# Patient Record
Sex: Male | Born: 1990 | Race: White | Hispanic: No
Health system: Western US, Academic
[De-identification: ages and names within clinical notes are randomized; demographics above are authoritative.]

## PROBLEM LIST (undated history)

## (undated) ENCOUNTER — Emergency Department (HOSPITAL_COMMUNITY): Admission: EM | Payer: Medicare Other | Source: Home / Self Care

## (undated) DIAGNOSIS — R569 Unspecified convulsions: Secondary | ICD-10-CM

## (undated) HISTORY — DX: Unspecified convulsions: R56.9

## (undated) NOTE — Progress Notes (Signed)
Formatting of this note might be different from the original.  SW was consulted to assist pt in safe dc planning.    Sw visited pt bedside at which pt was awake and watching tv. Pt advised SW that he came from [place] approximately 2 weeks ago to be with a friend he considers to be a sister.  Pt reported that he caught the [employer] to [place] and the friend gave him false hope regarding housing situation. Pt reports that he had been staying [facility] shelter since returning to [place] but can not go back to the shelter and did not wish to go into detail. Pt was tangential and talkative. Pt stated that she wants to be dc to the waiting room so that she may go to [facility] in the morning and get a voucher so that she may go to [place] where her boyfriend is.     SW contacted [facility] to verify pt's status with them.  [facility] confirmed that pt may not return but did not have any details to offer.  SW contacted [facility] which advised SW that they have space available and they would anticipate pt's arrival.    SW visited pt bedside and provided pt with the information for [facility] and educated pt on ways they may assist with her situation.     SW and pt discussed transportation options and how to pull bus route up on [employer].    Pt in agreement with plan.    SW communicated plan with medical team and advised that pt refused Seroquel offered by medic during SW visit. SW advised medical staff that pt appeared to be exhibiting symptoms of mania but otherwise denies SI/HI.    PLAN: Dc to [employer]. Gwenevere Abbot, MSW, LSW, [facility]  ED Social Worker      Electronically signed by Paulita Fujita, LSW at 04/16/2022 12:49 AM EDT

## (undated) NOTE — ED Provider Notes (Signed)
Formatting of this note is different from the original.    CHIEF COMPLAINT   Chief Complaint   Patient presents with   ? Suicidal With A Plan         HISTORY OF PRESENT ILLNESS   HPI  Patient is a 32 year old male with reported history of seizures or numbers of depression presenting to the emergency department reporting suicidal ideation with a plan to hang himself.  Has a history of same with attempted to hang himself at age 36.  Denies HI or auditory hallucinations.  Patient was seen earlier today in [facility] ED and discharged after social work eval.  Patient reports coming here because he continues to feel unsafe.  Denies medical complaints.  Denies COVID symptoms.  Denies chest pain, shortness of breath, cough, fevers, chills, URI symptoms.  No recent drug use or alcohol use.  He is currently homeless does not stay in shelters.    Patient reports taking Seroquel 300 mg nightly as well as Dilantin 300 mg nightly for his seizure disorder.  He has not taken his Dilantin tonight and asks for dose here.  He does report that he otherwise has his medications in his possession.      PAST MEDICAL AND SURGICAL HISTORY     Seizure disorder  Depression                     MEDICATIONS AND ALLERGIES     OUTPATIENT MEDICATIONS:   No current outpatient medications    ALLERGIES:   Bactrim [sulfamethoxazole-trimethoprim], Geodon [ziprasidone], Haloperidol, and Latex       SOCIAL HISTORY       Denies tobacco, alcohol, illicits.  Homeless.          PAST FAMILY HISTORY             REVIEW OF SYSTEMS   Review of Systems   Constitutional: Negative for fever and chills.   HENT: Negative for congestion.    Respiratory: Negative for shortness of breath.    Cardiovascular: Negative for chest pain.   Gastrointestinal: Negative for nausea and vomiting.   Genitourinary: Negative for dysuria.   Musculoskeletal: Negative for myalgias.   Skin: Negative for pallor.   Neurological: Negative for headaches.   Psychiatric/Behavioral: Positive for  suicidal ideas.         PHYSICAL EXAM   ED VITALS:  Vitals (Arrival)      T: 36.7 C (02/17/21 0046)  BP: 128/60 (02/17/21 0046)  HR: 92 (02/17/21 0046)  RR: 16 (02/17/21 0046)  SpO2: 95 % (02/17/21 0046) Room air   Vitals (Most recent in last 24 hrs)   T: 36.7 C (02/17/21 0046)  BP: 130/64 (02/17/21 0530)  HR: 82 (02/17/21 0530)  RR: 17 (02/17/21 0530)  SpO2: 96 % (02/17/21 0530) Room air  T range: Temp  Min: 36.3 C  Max: 36.7 C  (no weight taken for this visit)     (no height taken for this visit)     There is no height or weight on file to calculate BMI.     Physical Exam  Constitutional:       Comments: Alert young male sitting up in stretcher, pleasant, no acute distress   HENT:      Head: Normocephalic and atraumatic.      Right Ear: External ear normal.      Left Ear: External ear normal.      Nose: Nose normal.      Mouth/Throat:  Mouth: Mucous membranes are moist.      Pharynx: Oropharynx is clear.   Eyes:      Pupils: Pupils are equal, round, and reactive to light.   Neck:      Musculoskeletal: Normal range of motion.   Cardiovascular:      Rate and Rhythm: Normal rate and regular rhythm.      Pulses: Normal pulses.      Heart sounds: Normal heart sounds.   Pulmonary:      Effort: Pulmonary effort is normal.      Breath sounds: Normal breath sounds.   Abdominal:      General: Abdomen is flat.      Palpations: Abdomen is soft.   Musculoskeletal:         General: Normal range of motion.   Skin:     General: Skin is warm and dry.      Capillary Refill: Capillary refill takes less than 2 seconds.   Neurological:      General: No focal deficit present.      Mental Status: He is oriented to person, place, and time.   Psychiatric:         Mood and Affect: Mood normal.         Behavior: Behavior normal.         LABORATORY:   Labs Reviewed   CBC, DIFF       Result Value    WBC 5.80      RBC 5.55      Hemoglobin 16.0      Hematocrit 46      MCV 83      MCH 28.8      MCHC 34.6      Platelet Count 185       RDW-CV 12.6      % Neutrophils 61      % Lymphocytes 27      % Monocytes 9      % Eosinophils 2      % Basophils 1      % Immature Granulocytes 0      Neutrophils 3.59      Absolute Lymphocyte Count 1.55      Monocytes 0.51      Absolute Eosinophil Count 0.10      Basophils 0.04      Immature Granulocytes 0.01      Nucleated RBC 0.00      % Nucleated RBC 0     COMPREHENSIVE METABOLIC PANEL    Sodium 137      Potassium 3.7      Chloride 103      Carbon Dioxide, Total 26      Anion Gap 8      Glucose 101      Urea Nitrogen 15      Creatinine 0.77      Protein (Total) 6.9      Albumin 3.9      Bilirubin (Total) 0.3      Calcium 9.3      AST (GOT) 18      Alkaline Phosphatase (Total) 64      ALT (GPT) 30      eGFR by CKD-EPI >60     ALCOHOL (ETHYL)    Alcohol (Ethyl) Negative     STANDARD DRUG SCREEN, URN    Amphet/Methamphetamine Qual,URN Negative      Barbiturate (Qual), URN Negative      Benzodiazepines (Qual), URN Negative      Cocaine (  Qual), URN Negative      Alcohol (Ethyl), URN Negative      Methadone (Qual), URN Negative      Opiates (Qual), URN Negative      Phencyclidine (Qual), URN Negative      Cannabinoids (Qual), URN Negative      Tricyclic Antidepressants, URN Negative      Acetaminophen Qualitative, URN Negative      Fentanyl Qual, URN Negative      Drug Screen Test Info, URN SEE NOTES     SARS-COV-2 (COVID-19) QUALITATIVE RAPID PCR    COVID-19 Coronavirus Qual PCR Specimen Type Nasal swab      COVID-19 Coronavirus Qual PCR Result None detected      COVID-19 Coronavirus Qual PCR Interpretation        Value: This is a negative result. Laboratory testing alone cannot rule out infection, particularly in the presence of clinical risk factors such as symptoms or exposure history.    COVID-19 Qualitative PCR Indication Admission Surveillance     2ND EXTRA PEARL TOP    2nd Extra Pearl Top Additional collection tube     FIRST EXTRA URINE GRAY TOP       IMAGING:     ED Wet Read -   No orders to display      Radiology Final Result -   No image results found.        EKG DOCUMENTATION      No EKG was performed        TRAUMA/STROKE/STEMI ACTIVATION          SUICIDE RISK EVALUATION          SEPSIS          4TH  YEAR RESIDENT         ED COURSE/MEDICAL DECISION MAKING         Patient is a 11 year old male who presents emergency department reporting suicidal ideation with a plan to hang himself.  On arrival to the ED the patient is afebrile, ambulatory, no acute distress.  Vital signs are within normal limits for age.  Patient is calm and cooperative.  No overt toxidrome or evidence of intoxication.  No recent trauma.  Patient has no current medical complaints with the exception of requesting his evening dose of Dilantin.    Screening labs were obtained which are normal.  Urine drug screen ordered and pending.  I discussed the patient with psychiatric emergency services given his ongoing suicidal ideation and they will evaluate in the main ED.    Patient seen by psychiatric emergency services, cleared, will be discharged with PCP follow-up and outpatient mental health appointment later this week.        CLINICAL IMPRESSION/DISPOSITION   Clinical Impressions:   [R45.851] Suicidal ideation     Disposition: Discharge      CRITICAL CARE - ATTENDING ONLY   Critical Care - No Critical Care        ADDITIONAL INFORMATION REVIEWED  - ATTENDING Dayton Bailiff, MD  Resident  02/17/21 [phone]    Electronically signed by Lambert Keto, MD at 02/20/2021  3:00 PM PDT    Associated attestation - Utarnachitt, Cyndie Chime, MD - 02/20/2021  3:00 PM PDT  Formatting of this note might be different from the original.  Images from the original note were not included.  I saw and evaluated the patient. I have reviewed the resident's/fellow's findings and agree.     No  Critical Care    Richard B. Utarnachitt M.D.  Attending Physician  Department of Emergency Medicine  [facility]  02/20/21  3:00 PM

## (undated) NOTE — Progress Notes (Signed)
Formatting of this note is different from the original.  [facility]  [facility] - Tele-Behavioral Health Crisis Access Screening  Visit was conducted with the use of interactive audio and video telecommunications systems that permits real time communication between provider and patient    Patient location at time of Tele-Consult: [facility]  Provider Location at time of Tele-Consult: Remote  Patient Name:  Samuel Fry  Date of Birth:  July 08, 1991   Today's Date:  Jan 10, 2020    61 minutes spent related to assessment and crisis stabilization.    Provisional Diagnosis   Provisional Diagnosis: F99- Unspecified Mental D/O  Primary Presenting Problem: Mental Health  ASAM: N/A  LOCUS Scores: 18  Behavioral Health Acuity: Level 2    Disposition   MD Contact Name: Alvera Singh DO  MD Contact Date: 01/10/20  MD Contact Time: 2204  Disposition Recommendation: Inpatient Admission    Triage Screen     Type of Screen: If NOT Face to Face, Skip to Disposition Section): Face to Face  Referral Source: [facility]  Referral Source Contact Number: [phone]  Release Signed: No  Referral Source Contacted: Yes  Release for Community Providers: No  Information Provided By:: Patient  Court Appointed Guardian: No  Are you a Veteran?: No  Precipitating Factors: Clinet is a 55 year old male presenting to the ED with Suicidal ideation with a plan to hang himself.  Client was released from a 7 day stay at a behavioral health unit about 2 days ago. Client endorsed suicidal ideation and reports a plan to hang himself.   Client reports he needs a medical bed becasue he has a seizure disorder "its gets bad".  Date of last yearly physical:: unkown  Outside help or community services at home: None  Is there anyone that you know, or are related to, on the Behavioral Health unit?: No    General Information   Type of Screen: If NOT Face to Face, Skip to Disposition Section): Face to Face  Referral Source:  [facility]  Referral Source Contact Number: [phone]  Release Signed: No  Referral Source Contacted: Yes  Release for Community Providers: No  Information Provided By:: Patient  Court Appointed Guardian: No  Are you a Veteran?: No  Precipitating Factors: Clinet is a 12 year old male presenting to the ED with Suicidal ideation with a plan to hang himself.  Client was released from a 7 day stay at a behavioral health unit about 2 days ago. Client endorsed suicidal ideation and reports a plan to hang himself.   Client reports he needs a medical bed becasue he has a seizure disorder "its gets bad".  Date of last yearly physical:: unkown  Outside help or community services at home: None  Is there anyone that you know, or are related to, on the Behavioral Health unit?: No    Potential Risk to Self   Suicidal threats/behaviors in past 6 months?: Yes(Client reports threats to hang himself)  Suicidal Ideation or Suicide Threats: Yes(Client endorses sucidial ideations with a pland to hang himself)  Recent attempt to Harm Self?: No(Client responded "not yet")  Intent for above: Yes(Client responded with "yes")  Currently engaging in self-injurious behavior?: No  History of Suicidal/Self-Injuring behaviors?: Yes(Client reports a history of sucidial ideation.  Reports last inpatient hospitalization about 2 days ago.)  History of Suicidal/Self Injurious Behavior Last 6 months?: Yes  History of Suicidal/Self-Injuring behaviors  Greater than the past 6 months?: Yes  Access to firearms?: No  Other means of Harm?: No    Potential Risk to Others   Homicidal threats/behaviors in past 6 months?: No  Homicidal Ideation or Homicidal Threats?: No  Named Individual: No  Recent attempt to Harm Another?: No  Intent for above: No  Patient currently assaultive or combative?: No  History of Homicidal Acts/Assaultive behaviors?: No  History of Homicidal Acts/Assaultive behaviors within past 6 months?: No  History of Homicidal Acts/Assaultive behaviors    Greater than the past 6 months?: No  Access to firearms?: No  Other means of Harm?: No    Symptoms   Sleep pattern changed: No  Sleeping increased: No  Sleeping decreased: No  Problems: No  Use sleep aid: Yes  Type of Sleep Aid: Seroquel- 200mg  and trazadone    Appetite change: No  Weight change: No  Appetite Problems:: No    Hopelessness/Helplessness: No  Crying spells/mood swings: No  Low energy/fatigue: No  Concentration problems: No  Psychomotor retardation/agitation: No  Feelings of guilt/worthlessness: No  Social withdrawal: No  Recurrent thoughts of death: (Client reports sucidial ideation with a plan to hang himself)  Deterioration in Activities of Daily Living: No    Rapid pressured speech: No  Increase in impulsivity: No  Increase in energy: No  Flight of ideas/loose association: No    Excessive worry: No  Nervousness: No  Irritability: No  Shortness of breath: No  Racing heart rate: No  Sweaty/Chills/Hot flashes: No  Nausea/Vomiting/Diarrhea: No  Chest Pain: No    Additional Symptom Information: Client is a 35 year old male presenting with sucidial ideation, reports a plan to hang himself, and intentions to do so.  Client reports recent release from an inpatient facility about 2 days ago.  Client reports that he has a seizure condition and is requeting a "medical bed".  The client reports that he plans to move to [place] in 2 weeks.  Client reports that he does not have a therapist or a psycharist.  Denies homicidal ideation and or attmepts.    Psychosis   Delusions: No Delusions  Hallucinations: None  Ambivalence: No (Comment)  Confusion: No (Comment)  Disorganization: No (Comment)    Treatments   Treatments?: No  Did you follow up with your aftercare appointment?: Yes  Did you take your medication as prescribed?: Yes    Substance Use   Substance use in past 12 months?: No  Drug Screen: Negative  History of Substance Use/Abuse:: Patient Denies any history or Current Use  Type of Tobacco/Nicotine:  Cigarettes  Cigarette - Amount/Frequency: 1 PPD  Daily use of 5 or more cigarettes (more than 1/4 pack) in the past 30 days?: Yes  Tobacco/Nicotine -  Age of First Use: 18 yrs  Tobacco/Nicotine -  Duration of Current Use: continuous use  Tobacco/Nicotine -  Last Used: today  Tobacco/Nicotine -  Longest Period Not Used: unreported  Other non-substance addictive behaviors:: denied any    Alcohol Abuse   Alcohol abuse in past 12 months?: No  History of Alcohol Use/Abuse:: Patient Denies any history or Current Use  #1. How often do you have a drink containing alcohol?: Never  #9  Have you or someone else been injured as a result of your drinking?: No  #10  Has a relative, friend, doctor or health worker been concerned about your drinking or suggested you cut down?: No    Functioning   Dressing: Independent  Bathing: Independent  Toileting: Independent  Feeding: Independent  Hearing -  Right Ear: Difficulty with noise  Hearing - Left Ear: Difficulty with noise  Vision - Right Eye: Functional  Vision - Left  Eye: Functional  Walks in Home: Independent  Patient Fall Risk Level: Low/medium  Possible barriers to participate in Treatment/Programming?: No  Current living arrangements (who lives with): Client reports he is homeless until he moves to [place] in two weeks.  Able to return to Current Living Arrangements?: Yes  Support System:: friends that are supportive  Financial concerns: Housing;Utilities;Medications  Healthy coping skills: Other:(Watch [employer] and funny viedos)  Recreational/Leisure activities: known  Religious/Spiritual orientation: Unknown  Cultural Preferences: Unknown    Strengths/Limitations   Strength 1: inpersonations  Limitation 1: managment of weight    BH History   Patient Employed?: No  Problems at work?: No  History of Abuse?: (UTA)  Trauma: UTA  Bereavement: UTA    Legal Issues   Legal: No  Engineer, drilling?: No    Child/Adolescent Assessment       Mental Status   General Appearance: Older than  stated age;Disheveled  Motor Activity: Decreased;Agitated;Restless  Speech: Rambling  Exhibited Behavior: Cooperative;Impulsive;Friendly  Affect Range /Display: Wide  Mood Range Belenda Cruise: Wide  Affect/Mood Display: Calm;Bright  Mood: Expansive;Elated  Thought Process: WDL  Thought Content: Flight of Ideas;Loose Association  Insight: Limited  Orientation To:: Person (Yes);Place (Yes);Situation (Yes);Date (Yes)      Electronically signed:  Autumn Messing, LCSW  01/10/2020 / 10:08 PM    Electronically signed by Autumn Messing, LCSW at 01/10/2020 10:08 PM EDT

## (undated) NOTE — Progress Notes (Signed)
Formatting of this note might be different from the original.  Client endorses sucidal ideation,a plan to hang himself,and reports intentions to do so.  Client reports a recent discharge from a behavioral health facility about 2 days ago.  Client reports recurring thoughts of death. Client denies homicidal ideation, plans, or attempts.      Therapist consulted with the attending provider Dr. Alvera Singh DO. It is recommended that the client receive inpatient services.  The attending provider is in agreement with this disposition.   Electronically signed by Autumn Messing, LCSW at 01/10/2020 10:11 PM EDT

## (undated) NOTE — ED Notes (Signed)
Formatting of this note might be different from the original.  Pt is medically cleared per Dr. Katrinka Blazing.    Electronically signed by Doree Fudge., RN at 01/14/2021  1:58 AM CDT

## (undated) NOTE — ED Notes (Signed)
Formatting of this note might be different from the original.  Pt is in bed at this time in NAD. Pt. Was tickled on his foot and withdrew from it. Pt. Respirations are even and unlabored. Will continue to monitor.     Isaias Cowman, RN  09/22/16 1920  Electronically signed by Isaias Cowman, RN at 09/22/2016  7:20 PM CST
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
## (undated) NOTE — ED Triage Notes (Signed)
Formatting of this note might be different from the original.  Patient arrives via EMS, per medic patient was being checked out by LE and was asked to bring patient to ED for checkup after being off psych medications for a "couple days." Patient admits to being off medications after traveling from [place] and patient states he is feeling manic at this time. Patient is alert and oriented to person place time, denies pain.   Electronically signed by Leonie Douglas, RN at 03/17/2020  7:38 AM CDT

## (undated) NOTE — ED Notes (Signed)
Formatting of this note might be different from the original.  Clinicals faxed to [facility].    Manson Allan, RN  09/23/16 [phone]  Electronically signed by Manson Allan, RN at 09/23/2016  8:11 AM CST

## (undated) NOTE — ED Provider Notes (Signed)
Formatting of this note is different from the original.  History   Chief Complaint   Patient presents with   ? Chest Pain     CP for 2 hrs.      HPI: Patient is a 71 y/o WM with PMH Asperger, ADHD, bipolar disorder who presents with c/o chest pain. Began two hours PTA while sitting at a bar smoking. Reports that he has frequent chest pains that feel the same with frequent ED visits, but has never been diagnosed with a cause for them. 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 No recent vomiting or trauma. Not associated with SOB or diaphoresis. It is sharp and stabbing and located across his left chest. Nothing makes better or worse. He has not taken anything for it. He was admitted to psych unit for SI at [facility] from last Saturday until he was discharged yesterday. Denies current SI/HI/AVH. States he is from [place] and did not have anywhere to go today, so he has been wandering around. States he has a ticket to get on the train to [place] this morning at 1100, and will then catch a connecting train from [place] to [place]. No known family history, as he is adopted.     HISTORY:   History documented here is for the purposes of medical decision making and the ED physician note. It will NOT file to the patient's permanent history.:     Past Medical History:   Diabetes:  No    Social History:   Tobacco:  Tobacco use   ETOH:  No    Illicit Drugs:  No       Past Medical History:   Diagnosis Date   ? ADHD (attention deficit hyperactivity disorder)    ? Asperger syndrome    ? Bipolar 1 disorder    ? Bowel and bladder incontinence        Past Surgical History:   Procedure Laterality Date   ? BOWEL RESECTION      Questionable as no abdominal scar noted       Family History   Problem Relation Age of Onset   ? Arthritis Mother    ? Arthritis Sister    ? Diabetes Neg Hx    ? Heart disease Neg Hx    ? Hypertension Neg Hx    ? Arthritis Maternal Aunt        Social  History     Tobacco Use   ? Smoking status: Current Every Day Smoker     Packs/day: 1.00     Years: 0.70     Pack years: 0.70   ? Smokeless tobacco: Never Used   Substance Use Topics   ? Alcohol use: No   ? Drug use: No     Review of Systems   Constitutional: Negative for chills and fever.   HENT: Negative for congestion, postnasal drip, rhinorrhea, sinus pressure, sinus pain and sore throat.    Eyes: Negative for pain and visual disturbance.   Respiratory: Negative for chest tightness, shortness of breath and wheezing.    Cardiovascular: Positive for chest pain. Negative for palpitations and leg swelling.   Gastrointestinal: Negative for abdominal pain, constipation, diarrhea, nausea and vomiting.   Genitourinary: Negative for decreased urine volume, difficulty urinating and dysuria.   Musculoskeletal: Negative for back pain and neck pain.   Skin: Negative for pallor, rash and wound.   Neurological: Negative for dizziness, seizures, syncope, weakness, light-headedness,  numbness and headaches.   Psychiatric/Behavioral: Negative for hallucinations and self-injury.   All other systems reviewed and are negative.     Physical Exam   BP 135/87 (BP Location: Right arm)   Pulse 92   Temp 98.2 F (36.8 C) (Oral)   Resp 18   Ht 1.702 m (5\' 7" )   Wt (!) 112.5 kg (248 lb)   SpO2 100%   BMI 38.84 kg/m     Physical Exam  Vitals signs and nursing note reviewed.   Constitutional:       General: He is not in acute distress.     Appearance: He is well-developed. He is not toxic-appearing or diaphoretic.   HENT:      Head: Normocephalic and atraumatic.      Right Ear: External ear normal.      Left Ear: External ear normal.      Nose: Nose normal.   Eyes:      General: Lids are normal. No scleral icterus.     Conjunctiva/sclera: Conjunctivae normal.   Neck:      Musculoskeletal: Normal range of motion and neck supple.   Cardiovascular:      Rate and Rhythm: Normal rate.      Pulses:           Radial pulses are 2+ on the  right side and 2+ on the left side.      Heart sounds: S1 normal and S2 normal.   Pulmonary:      Effort: Pulmonary effort is normal. No respiratory distress.      Breath sounds: No rhonchi.   Chest:      Chest wall: Tenderness (L costosternal joint) present. No mass, lacerations, deformity, swelling, crepitus or edema.   Abdominal:      General: There is no distension.      Palpations: Abdomen is soft.      Tenderness: There is no abdominal tenderness. There is no guarding.   Musculoskeletal: Normal range of motion.   Skin:     General: Skin is warm and dry.      Coloration: Skin is not pale.   Neurological:      Mental Status: He is alert and oriented to person, place, and time.      GCS: GCS eye subscore is 4. GCS verbal subscore is 5. GCS motor subscore is 6.      Gait: Gait normal.   Psychiatric:         Attention and Perception: Attention normal.         Mood and Affect: Mood normal.         Speech: Speech normal.         Behavior: Behavior is cooperative.         Thought Content: Thought content does not include homicidal or suicidal ideation.         Cognition and Memory: Cognition and memory normal.          ED Course   Procedures    MDM  -Initial vital signs reviewed and WNL. Afebrile.   -Physical exam as above  -EKG NSR at rate of 82 with normal intervals and no ST segment or T wave changes  -CXR with no cardiopulmonary changes  -Chem 8 WNL  -1L NS bolus given and toradol given for pain with moderate relief    Disposition:  -At this time, I feel that patient has reached maximum benefit from ED workup, management and observation. I explained all findings  to patient and answered all questions to their satisfaction. I educated them on the general/expected course of the condition, and also informed them that our evaluation in the emergency department today is Samuel evaluation of the condition in one moment in time. If there is any worsening of the condition or if symptoms persist, the patient was instructed to  return to the ED immediately for further evaluation. Patient voiced understanding of all instructions and is agreeable to disposition.  -Follow up with PCP in 1 week for re-evaluation  -Discharged to home in good condition.     ED COURSE as of Feb 08 807   Thu Feb 08, 2019   0627 POCT SODIUM: 139 [TL]   0627 POCT POTASSIUM: 3.6 [TL]   0627 POCT CHLORIDE: 103 [TL]   0627 POCT TCO2: 26 [TL]   0627 POCT BUN: 15 [TL]   0627 POC GLU HEP WHOLE BLOOD: 88 [TL]   0627 POC IONIZED CALCIUM: 1.24 [TL]   0627 POCT HEMATOCRIT: 51.0 [TL]   0627 POC HEMOGLOBIN (CALC)(!): 17.3 [TL]   0627 Creatinine Serum/WB: 0.80 [TL]   0627 eGFR: >=60 [TL]   0627    IMPRESSION:     1. No active cardiopulmonary disease.      XR Chest 1 View [TL]      Clinical Impressions   Final diagnoses:   Costochondral chest pain         Plan   ED Disposition     ED Disposition Condition Comment    Discharge Stable [facility] discharge to home/self care.           Discharge information if applicable:   Follow-up Information     Follow up With Specialties Details Why Contact Info    your primary care doctor  In 1 week           Discharge Medications     Medications To Continue    citalopram 40 mg tablet  Refills:  0  Commonly known as:  CELEXA    divalproex 500 MG EC tablet  Refills:  0  Commonly known as:  DEPAKOTE    * phenytoin 300 MG ER capsule  Refills:  0  Commonly known as:  DILANTIN    * phenytoin 300 MG ER capsule  Refills:  0  Commonly known as:  DILANTIN    PROZAC PO  Refills:  0    * QUEtiapine 400 MG tablet  Refills:  0  Commonly known as:  SEROQUEL    * QUEtiapine 300 MG tablet  Refills:  0  Commonly known as:  SEROQUEL    traZODone 150 MG tablet  Refills:  0  Commonly known as:  DESYREL       * There are duplicate medications prescribed to the patient               Lorie Apley, MD  Resident  02/08/19 [phone]    Electronically signed by Karmen Stabs., MD at 02/08/2019  2:36 PM CDT    Associated attestation - Shermer, Willene Hatchet.,  MD - 02/08/2019  2:36 PM CDT  Formatting of this note might be different from the original.    I performed a history and physical examination of Samuel Fry and discussed his management with the Emergency Medicine resident(s).  I agree with the history, physical, assessment, and plan of care, with the following exceptions: None    ECG normal.      CXR: Xr Chest 1  View    Result Date: 02/08/2019  IMPRESSION: 1. No active cardiopulmonary disease.   Attending Radiologist: Illene Labrador. Effie Shy, M.D. 02/08/2019 6:22 AM      Treated symptomatically.  Review of EMR shows multiple visits to multiple EDs for similar concern.  He is leaving for [place] via train at 1100.  Has ticket with him.  Has been wandering about since recent discharge from OSH and is hungry and thirsty.  No SI/HI or hallucinations.  Stable for discharge.    Karmen Stabs, MD

## (undated) NOTE — ED Notes (Signed)
Formatting of this note might be different from the original.  Attempted to transport pt to [facility], advised pt no longer accepted. [facility] and [facility] notified.    Manson Allan, RN  09/23/16 1303  Electronically signed by Manson Allan, RN at 09/23/2016  1:03 PM CST

## (undated) NOTE — ED Notes (Signed)
Formatting of this note might be different from the original.  BH assessment complete  Electronically signed by Netta Corrigan, RN at 01/12/2020 10:48 AM EDT

## (undated) NOTE — ED Notes (Signed)
Formatting of this note might be different from the original.  [facility] consult called in   Electronically signed by Mellody Memos, CNA at 01/10/2020  6:56 PM EDT
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
## (undated) NOTE — ED Notes (Signed)
Formatting of this note might be different from the original.  Pt is up in the bed at this time. Pt. In NAD. Pt. Watching TV. Pt. Respirations are even and unlabored. Will continue to monitor    Isaias Cowman, RN  09/22/16 2207  Electronically signed by Isaias Cowman, RN at 09/22/2016 10:07 PM CST
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
## (undated) NOTE — ED Notes (Signed)
Formatting of this note might be different from the original.  Patient redirected to plan of care. No time on [facility] transport.   Electronically signed by Abbey Chatters Ridenhour, RN at 01/14/2020  3:25 PM EDT

## (undated) NOTE — Unmapped External Note (Signed)
Formatting of this note might be different from the original.  Patient medically evaluated and cleared by my colleague.  Patient was evaluated by PSS as well as Dr. Cherly Hensen of psychiatry.  He has documented that he does not feel the patient requires hospitalization for stabilization as he suspects secondary gain.  Patient will be discharged from the emergency department.  Electronically signed by Arelia Sneddon, MD at 05/01/2020  5:07 PM EDT

## (undated) NOTE — ED Notes (Signed)
Formatting of this note might be different from the original.  Patient ambulated to the restroom without assistance or difficulty.   Electronically signed by Buzzy Han, RN, BSN at 01/14/2020  3:49 AM EDT
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
## (undated) NOTE — Progress Notes (Signed)
Formatting of this note is different from the original.  [facility] Psychiatry - ED Consult    Date of Service: 01/13/2020  Referral Source: Emergency Department  Record Review: moderate  Assessment     Psychiatric Diagnoses:  Principal Problem:    Bipolar affective disorder, current episode mixed (*)  Active Problems:    Asperger's syndrome    ADHD    Medical Diagnoses:      Formulation and MDM: Client endorses sucidal ideation,a plan to hang himself,and reports intentions to do so.  Client reports a recent discharge from a behavioral health facility about 2 days ago.  Client reports recurring thoughts of death. Client denies homicidal ideation, plans, or attempts    The patient has been evaluated and determined to be medically stable by the ED provider.  Patient has been assessed by the ED [facility] Therapist and the findings have been discussed with this provider.  Psychiatry was consulted to assist with psychiatric assessment and treatment/disposition planning.  The chart has been reviewed and pertinent findings are noted below. Based on this review and assessment, the treatment plan has been created and discussed with the treatment team.     Based on my assessment, patient requires psychiatric hospitalization due to risk of self injury.    Safety Assessment:  Individualized risk factors include: hopelessness and impulsiveness.  Individualized protective factors include: patient has treatable psychiatric disorders and symptoms.  Taking the aforementioned non-modifiable and modifiable risk factors in conjunction with his protective factors, the patient is currently felt to be of low imminent risk of harm to self.  To further mitigate risk, please see the below treatment recommendations.    Treatment Plan & Recommendation     - Disposition:   - Seek Inpatient Psychiatric Hospitalization  - Commitment Status: Involuntary  - Precautions:   - suicide  - Pertinent Labs:   - UDS negative   Chem profile reviewed:      ALT 79   AST 42  - Psych Med Recs:   - Prozac 20mg  po daily for depression, Seroquel 200mg  po q hs for mood/ruminations/insomnia.   - Medical Recs:   -     Chief Complaint     Suicidal with plan to hang self.     History of Present Illness     Samuel Fry is a 41 y.o. male with a history of  who presented to the ED for Client endorses sucidal ideation,a plan to hang himself,and reports intentions to do so.  Client reports a recent discharge from a behavioral health facility about 2 days ago.  Client reports recurring thoughts of death. Client denies homicidal ideation, plans, or attempts    On interview:  No acute issues over night. He is asking to leave but reports he has not place to go with a plan to walk to [place] and eventual goal to move to [place]. He does not want outpatient care. He has little to no insight it appears into his situation and reports no social support. He denies having S/I today. We talked about need to continue with admission plan for now.     Review Of Systems:  A complete review of systems of the following systems was conducted (Constitutional, Psychiatric, Neurological, Musculoskeletal, Eyes, Gastrointestinal, Cardiovascular, Respiratory, Skin, and Endocrine). All reviewed systems are negative except pertinent positives identified in the HPI.    Past Psychiatric History     Previous diagnoses:Bipolar disorder, Autism (Asperger's), ADHD  Previous psychiatric  medication trials:prozac, trazodone, dilantin, seroquel  Past suicidal/homicidal ideation/attempt: denies attempts  Current/Past psychiatric provider:none current, formerly Dr. Leonard Schwartz with [facility]  Previous psychiatric hospitalizations/Rehab:[facility] in [place] - pt clarifies this was a medical hospitalization    Past Medical History     Past Medical History:   Diagnosis Date   ? ADHD    ? Asperger's disorder    ? Bipolar disorder (*)      Substance Use History     Marijuana:  Denies  Cocaine: Denies  Opiates: Denies  Stimulants: Denies  Benzodiazepine: Denies  Tobacco:  Smoker 1 pack a day  Alcohol: Denies  Other illicit drug usage: Denies    Patient denies all other substance use except for what is listed above.    Readiness for substance/alcohol abuse treatment, if applicable: Not applicable    Family History     Family history of suicide? Unknown    Family History   Family history unknown: Yes     Social History     Access to firearms: pt denies    Social History     Socioeconomic History   ? Marital status: Single     Spouse name: Not on file   ? Number of children: Not on file   ? Years of education: Not on file   ? Highest education level: Not on file   Occupational History   ? Not on file   Tobacco Use   ? Smoking status: Current Some Day Smoker     Packs/day: 1.00     Start date: 01/29/2012   ? Smokeless tobacco: Never Used   Substance and Sexual Activity   ? Alcohol use: Not Currently     Comment: L/U 10 yearsa ago   ? Drug use: Not Currently     Comment: L/U 10 years ago   ? Sexual activity: Not on file   Other Topics Concern   ? Not on file   Social History Narrative   ? Not on file     Social Determinants of Health     Financial Resource Strain:    ? Difficulty of Paying Living Expenses:    Food Insecurity:    ? Worried About Programme researcher, broadcasting/film/video in the Last Year:    ? Barista in the Last Year:    Transportation Needs:    ? Freight forwarder (Medical):    ? Lack of Transportation (Non-Medical):    Physical Activity:    ? Days of Exercise per Week:    ? Minutes of Exercise per Session:    Stress:    ? Feeling of Stress :    Social Connections:    ? Frequency of Communication with Friends and Family:    ? Frequency of Social Gatherings with Friends and Family:    ? Attends Religious Services:    ? Active Member of Clubs or Organizations:    ? Attends Banker Meetings:    ? Marital Status:    Intimate Partner Violence:    ? Fear of Current or Ex-Partner:     ? Emotionally Abused:    ? Physically Abused:    ? Sexually Abused:      Evaluation     Vitals:   Vitals:    01/13/20 0854   BP: 119/68   Pulse: 60   Resp:    Temp: 98 F (36.7 C)   SpO2: 99%     Medications:  Allergies:  Allergies   Allergen Reactions   ? Bactrim [Sulfamethoxazole-Trimethoprim] Other     Make hair fall out   ? Geodon [Kdc:Yellow Dye+Ziprasidone+Brilliant Blue Fcf] Hives   ? Haldol [Haloperidol] Other     Makes pt feel like mouth is pried open   ? Latex Rash   ? Other Other     rash   ? Tea Other     doesn't want any     Labs:  No results found for this or any previous visit (from the past 24 hour(s)).    Mental Status Evaluation     Constitutional:    General Appearance Wearing hospital scrubs and normal appearance    General Behavior Irritated, Defensive and Withdrawn   Musculoskeletal:    Gait and Station In bed for duration of assessment    Strength and tone Normal   Psychiatric:    Psychomotor Activity No motor abnormalities   Speech  Soft   Mood  Depressed   Affect  Flat and Depressed   Thought Process Circumstantial   Associations Intact association   Thought Content/Perceptual Disturbances Evidence of:  Ruminations  Suicidal ideation   Cognition/Sensorium  AAOx4; Memory, attention, language, and fund of knowledge intact    Insight  Poor   Judgment Poor     Electronically signed by:  Vania Rea, MD  01/13/2020 11:48 AM      Electronically signed by Vania Rea, MD at 01/13/2020 11:59 AM EDT

## (undated) NOTE — ED Notes (Signed)
Formatting of this note might be different from the original.  Pt updated on POC. Placement questions answered. Pt denies any requests at this time.    Nedra Hai, RN  09/23/16 1557  Electronically signed by Nedra Hai, RN at 09/23/2016  3:57 PM CST

## (undated) NOTE — ED Notes (Signed)
Formatting of this note might be different from the original.  Pt is asleep at this time on his side. Pt respirations are even and unlabored. Pt in no acute distress. Will continue to monitor.     Isaias Cowman, RN  09/23/16 0321  Electronically signed by Isaias Cowman, RN at 09/23/2016  3:21 AM CST

## (undated) NOTE — ED Triage Notes (Signed)
Formatting of this note might be different from the original.  PT STATED WANTS TO COMMIT SUICIDE   PLAN TO WHEEL HIMSELF OFF BRIDGE   Electronically signed by March Rummage, RN at 09/21/2016  6:40 PM CST

## (undated) NOTE — ED Notes (Signed)
Formatting of this note might be different from the original.  Heather with [facility] to have pt reevaluated, last eval on 1/23.    Manson Allan, RN  09/23/16 1317  Electronically signed by Manson Allan, RN at 09/23/2016  1:17 PM CST

## (undated) NOTE — Progress Notes (Signed)
Formatting of this note is different from the original.  PES Social Work Assessment    ID / CC / Reason for Referral:   Samuel Fry is a 49 year old male with reported history of BPAD and ASD, who self-presented to ED tonight c/o SI w/ plan to hang himself, after having presented to [facility] ED several hours earlier w/ the same complaint and [place] and not being able to get in due to not having an ID on him.  PES SW referred to assist with care coordination.     Chief Complaint   Patient presents with   ? Suicidal With A Plan     Identifying data/reason for referral:  Referral Source: Physician  Referral Reason: Other (Comment) (Care coordination)    Social Work Summary:  HPI:  PES SW contacted the [facility] [phone] to obtain psych background info.  They had no record of the pt.  He had been admitted voluntarily to [facility] recently so he would not show up in their system.  While at [facility] ED at 20:00 on 06/20, the pt had been given a 12:30 NDA for Wed at [facility].  SW then contacted [facility] after-hours clinician Belenda Cruise, who reports no NDA documented for the pt.  The pt?s NDA was obtain @ 20:00 yesterday so it is understandable that it is not in the system yet.  Pt was assessed by PES provider.  PES SW consulted with care team regarding patient needs and treatment plan. Pt NOT appropriate for voluntary inpatient psych treatment.    Pt was considered for CDF, but their queue was at 6, which is too long for referral at this time.  SW spoke with the pt about his lost ID and his plans for obtaining shelter and food.  The pt reports that he can get into shelters without his ID, which is not the actual case.  Pt was prompted to visit one of the day centers listed on the shelter resource sheet provided to him to inquire about food resources.  He was also told to follow up with his [facility] appointment on Wednesday so that they could assist him with potentially getting a form of  ID.  Pt reports that his ID is from [place] and that he doesn?t need it to get back to [place], where he is planning to go once his next SSDI funds activate.  He stated that he needs a safe place to be until then.      Pt reported that he is not feeling like he wanted to hurt himself and that he wanted to be removed from his restraints.  He began asking what other hospitals in the area have inpatient psychiatric units.  He was advised of other hospitals with emergency departments, but informed that he could not just show up to an inpatient psych facility and ask to be admitted.  He was reminded to first try to access shelter in the community after visiting a day shelter.  Pt agreed to do so.  Pt was aware that he can call the crisis line or return to an ED if he is feeling unsafe.  Pt does have a functional cell phone.  He was provided a bus ticket for safe transport to day shelter.      Social History:  Living situation prior to admit: Homeless  Support system: Spouse/significant other    Psychiatric History:          Substance  Use History:  Substance Abuse History Indicated?: Not Indicated  Social History     Tobacco Use   ? Smoking status: Not on file   ? Smokeless tobacco: Not on file   Substance Use Topics   ? Alcohol use: Not on file     History   Drug Use Not on file         Coverage Information:  Insurance Information       MEDICARE/MEDICARE PART A AND B Phone: [phone]    Subscriber: Samuel Fry, Samuel Fry Subscriber#: [id number]    Group#: -- Precert#: --      [place] Phone: [phone]    Subscriber: Samuel Fry, Samuel Fry Subscriber#: [id number]    Group#: -- Precert#: --           Language: English  Interpreter: No     Impression:  Samuel Fry is a 31 year old male with reported history of BPAD and ASD, who self-presented to ED tonight c/o SI w/ plan to hang himself, after having presented to [facility] ED several hours earlier w/ the same complaint and [place]  and not being able to get in due to not having an ID on him.  Pt was assessed by PES provider.  PES SW consulted with care team regarding patient needs and treatment plan.  Pt deemed appropriate for discharge.       Plan:  Pt discharged from medical ED with resources.      Reinaldo Raddle [facility]  Psychiatric Emergency Services Social Work  [employer]  [phone]  SW Emergency department services (minutes): 60    Electronically signed by Marilynn Rail, LICSW at 02/17/2021  6:52 AM PDT
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
## (undated) NOTE — ED Notes (Signed)
Formatting of this note might be different from the original.  [facility] returned page.  Everardo All., RN Talking to them.  Electronically signed by Modena Slater, RN at 03/17/2020  9:15 AM CDT

## (undated) NOTE — Progress Notes (Signed)
Formatting of this note is different from the original.  [facility]    Patient:   Samuel Fry  MR Number:  [id number]  Patient Date of Birth: Jan 30, 1991  Age/Sex:  28 y.o./male        Faxed COVID results to [facility].    Electronically signed:  Alveta Heimlich, [facility]  01/14/2020 / 8:08 AM      Electronically signed by Alveta Heimlich, [facility] at 01/14/2020  8:08 AM EDT

## (undated) NOTE — ED Provider Notes (Signed)
Formatting of this note is different from the original.  [facility]    ED Progress Note    Patient was seen and examined today on rounds.  Patient remains medically stable. The patient's current condition, medical history, and existing medication list has discussed with nursing staff.     Daily events/issues: No active medical issues reported to me from overnight.    BP 98/62   Pulse 70   Temp 98.2 F (36.8 C) (Oral)   Resp 18   Ht 1.803 m (5\' 11" )   Wt 128.4 kg (283 lb)   SpO2 97%   BMI 39.47 kg/m     Constitutional: No distress. No change in overall state since last evaluation.  Cardiovascular: RRR   Pulmonary/Chest: No stridor. No respiratory distress.   Abdominal: There is no guarding or peritoneal signs.  Musculoskeletal: No calf swelling or tenderness noted.  Neurological: Alert.   Skin: Skin is warm.   Nursing note and vitals reviewed.    Assessment and Plan: Patient in the emergency department secondary to need for mental health treatment.    Involuntary Commitment?:  Yes  DVT Prophylaxis: Ambulatory  Meds:     Current Facility-Administered Medications:   ?  FLUoxetine (PROZAC) capsule 20 mg, 20 mg, Oral, Daily, Jamie B Ridenhour, FNP, 20 mg at 01/11/20 1311  ?  QUEtiapine fumarate (SEROQUEL) tablet 200 mg, 200 mg, Oral, HS, Jamie B Ridenhour, FNP, 200 mg at 01/11/20 2031    Current Outpatient Medications:   ?  FLUoxetine (PROZAC) 20 MG tablet, Take one tablet (20 mg dose) by mouth daily., Disp: 30 tablet, Rfl: 0  ?  QUEtiapine fumarate (SEROQUEL) 200 mg tablet, Take one tablet (200 mg dose) by mouth at bedtime., Disp: 30 tablet, Rfl: 0  ?  traZODone (DESYREL) 100 MG tablet, Take one tablet (100 mg dose) by mouth at bedtime., Disp: 30 tablet, Rfl: 0  ?  nicotine polacrilex (NICORETTE) 2 mg gum, Take one each (2 mg dose) by mouth as needed for Smoking cessation., Disp: 20 each, Rfl: 0  ?  phenytoin sodium (DILANTIN) 100 mg ER capsule, Take three capsules (300 mg dose) by mouth  daily. For 7 days, Disp: 90 capsule, Rfl: 0    Disposition: Awaiting placement    Electronically Signed by:    Celestia Khat, MD  01/12/20 [phone]    Electronically signed by Celestia Khat, MD at 01/12/2020  7:07 AM EDT

## (undated) NOTE — ED Notes (Signed)
Formatting of this note might be different from the original.  Pt incontinent of urine. Diaper changed and peri-care preformed. No other needs verbalized at this time. Will continue to monitor.     Christie Beckers. Aurther Loft, RN  09/23/16 [phone]  Electronically signed by Minna Antis., RN at 09/23/2016  8:08 AM CST

## (undated) NOTE — ED Notes (Signed)
Formatting of this note might be different from the original.  Pt loaded on Research officer, political party for transport to [facility], pt belongings, original PEC and chart provided to EMS. Pt updated on plan of care.   Electronically signed by Simon Rhein, RN at 01/14/2021  8:45 AM CDT

## (undated) NOTE — Progress Notes (Signed)
Formatting of this note might be different from the original.  [facility] Peer Recovery Support Session    Start Time: 10:00  Stop Time: 10:30  Encounter Type: Peer Recovery     Met with Samuel Fry today to provide peer recovery support.     Session Focus: Pt shared [facility] and they are the only ones that will help. This PRC partnered with PRC at [facility] for Transportation. Pt was clear on what he wanted and needed.    Suicide/Homicide Screen: No SI or HI  Patient's Goal: " I need [facility] get to [facility] so I can prepared to go to an [facility] Group Home in [place]"   Plan  Zae to follow up with Peer Recovery Coach.  Electronically signed by [name] Mead at 03/29/2022 11:55 AM EDT

## (undated) NOTE — ED Provider Notes (Signed)
Formatting of this note is different from the original.  [facility]    ED Progress Note      Patient was seen and examined today on rounds.  Patient remains medically stable. The patient's current condition, medical history, and existing medication list has discussed with nursing staff.     Daily events/issues: No active medical issues reported to me from overnight.    BP 110/71   Pulse 67   Temp 98.3 F (36.8 C) (Oral)   Resp 16   Ht 1.803 m (5\' 11" )   Wt 128.4 kg (283 lb)   SpO2 97%   BMI 39.47 kg/m     Constitutional: No distress. No change in overall state since last evaluation.  Cardiovascular: RRR  Pulmonary/Chest: No stridor. No respiratory distress.   Abdominal: There is no guarding or peritoneal signs.  Musculoskeletal: No calf swelling noted  Neurological: Alert.   Skin: Skin is warm.   Nursing note and vitals reviewed.    Assessment and Plan: Patient in the emergency department secondary to suicidal ideation    Involuntary Commitment?:  Yes  DVT Prophylaxis: Ambulatory  Meds:     Current Facility-Administered Medications:   ?  FLUoxetine (PROZAC) capsule 20 mg, 20 mg, Oral, Daily, Jamie B Ridenhour, FNP, 20 mg at 01/12/20 0926  ?  QUEtiapine fumarate (SEROQUEL) tablet 200 mg, 200 mg, Oral, HS, Jamie B Ridenhour, FNP, 200 mg at 01/12/20 2002    Current Outpatient Medications:   ?  FLUoxetine (PROZAC) 20 MG tablet, Take one tablet (20 mg dose) by mouth daily., Disp: 30 tablet, Rfl: 0  ?  QUEtiapine fumarate (SEROQUEL) 200 mg tablet, Take one tablet (200 mg dose) by mouth at bedtime., Disp: 30 tablet, Rfl: 0  ?  traZODone (DESYREL) 100 MG tablet, Take one tablet (100 mg dose) by mouth at bedtime., Disp: 30 tablet, Rfl: 0  ?  nicotine polacrilex (NICORETTE) 2 mg gum, Take one each (2 mg dose) by mouth as needed for Smoking cessation., Disp: 20 each, Rfl: 0  ?  phenytoin sodium (DILANTIN) 100 mg ER capsule, Take three capsules (300 mg dose) by mouth daily. For 7 days, Disp: 90  capsule, Rfl: 0    Disposition: Awaiting placement    Electronically Signed by:    Celestia Khat, MD  01/13/20 [phone]    Electronically signed by Celestia Khat, MD at 01/13/2020  6:32 AM EDT

## (undated) NOTE — ED Notes (Signed)
Formatting of this note might be different from the original.  Patient unable to provide urine sample.  Continues to urinate into brief.    Electronically signed by Lynda Rainwater, RN at 01/11/2020  6:12 PM EDT

## (undated) NOTE — ED Notes (Signed)
Formatting of this note might be different from the original.  All of pts belongings returned to pt. Pt escorted to exit by security.   Electronically signed by Lavada Mesi, RN at 05/01/2020  5:32 PM EDT

## (undated) NOTE — ED Notes (Signed)
Formatting of this note might be different from the original.  [facility] here for re-eval on pt.     Manson Allan, RN  09/23/16 1456  Electronically signed by Manson Allan, RN at 09/23/2016  2:56 PM CST

## (undated) NOTE — ED Notes (Signed)
Formatting of this note might be different from the original.  Left message to get [facility] and accepting MD info to transfer pt.    Manson Allan, RN  09/23/16 1003  Electronically signed by Manson Allan, RN at 09/23/2016 10:03 AM CST

## (undated) NOTE — Progress Notes (Signed)
Formatting of this note is different from the original.  [facility] [employer] [facility]      This [facility] Clinician spoke to [place] @ the [facility]. Patient is too young for the current BH unit that have beds available today.    Electronically signed:  Karrie Meres, LCSW  01/12/2020 / 1:15 PM      Electronically signed by Jeanette Caprice, LCSW at 01/12/2020  1:15 PM EDT

## (undated) NOTE — Nursing Note (Signed)
Formatting of this note might be different from the original.  Patient is afebrile and not presenting with signs of covid. Complaints of insomnia and given Trazodone 100mg  po at 21:39. Currently appears to be sleeping in assigned room. Med effective.   Electronically signed by Tye Savoy., RN, OBHP at 12/20/2021  1:48 AM EDT

## (undated) NOTE — ED Notes (Signed)
Formatting of this note might be different from the original.  Meal tray ordered for patient.   Electronically signed by Simon Rhein, RN at 01/14/2021  7:23 AM CDT

## (undated) NOTE — ED Notes (Signed)
Formatting of this note is different from the original.  First contact with Samuel Fry.   Chief Complaint   Patient presents with    Medication Refill     Pt presents for refill on prozac, dilantin, seroquel, Pt denies SI/HI. Pt is calm and cooperative.      Pt was just discharged from inpatient at [facility] and was given his prescriptions but he reports he "lost them". Pt was wandering around a [employer] and PD requested EMS assistance. Pt has a history of autism, PTSD, ADHD and depression. Pt is A&Ox4, no signs of distressed noted. Pt denies suicidal/homicidal ideations at this time. Pt denies auditory/visual hallucinations at this time. Pt denies any current pain. Pt updated on plan of care.     Electronically signed by Manon Hilding, RN at 08/09/2021  8:52 PM EST

## (undated) NOTE — Progress Notes (Signed)
Formatting of this note might be different from the original.  Therapist made a telephone referral for bed placement with Th[facility] Ohm at [place].  Awaiting bed placement information.   Electronically signed by Autumn Messing, LCSW at 01/10/2020 10:14 PM EDT
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
## (undated) NOTE — ED Notes (Signed)
Formatting of this note might be different from the original.  [facility] contacted for update, pts is waiting [facility].    Manson Allan, RN  09/23/16 0800  Electronically signed by Manson Allan, RN at 09/23/2016  8:00 AM CST

## (undated) NOTE — Progress Notes (Signed)
Formatting of this note is different from the original.  [facility]    Patient:   Samuel Fry  MR Number:  [id number]  Patient Date of Birth: 03-02-1991  Age/Sex:  28 y.o./male        Notified RN, Eilene Ghazi of patient's acceptance at [facility].    Electronically signed:  Alveta Heimlich, [facility]  01/14/2020 / 8:15 AM      Electronically signed by Alveta Heimlich, [facility] at 01/14/2020  8:15 AM EDT
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
## (undated) NOTE — Procedures (Signed)
Associated Order(s): SRA  Post-Procedure Diagnose(s): Autism; Encounter for medication refill  Formatting of this note might be different from the original.  SRA  Performed by: Antony Salmon, MD  Authorized by: Farrel Conners., MD     SI, plan, behavior, intent:     Have you wished you were dead or wished you could go to sleep and not wake up?: Yes      Have you actually had any thoughts about killing yourself?: No      Have you done anything, started to do anything or prepared to anything to end your life?: No    Aggression/Homicidal Screen:     Thoughts of harming others?: No    Risk factors:     Non-modifiable risk factors:  Previous mental health diagnosis    Modifiable risk factors:  Impulsivity/little capacity to delay aggressive or suicidal thoughts, Social isolation and Current instability in environment/psychosocial factors    Protective factors:  Positive social support and Future oriented/goal directed  Electronically signed by Farrel Conners., MD at 08/09/2021 10:21 PM EST    Associated attestation - Farrel Conners., MD - 08/09/2021 10:21 PM EST  Formatting of this note might be different from the original.  I saw and evaluated the patient. Discussed with the resident and agree with the resident's assessment and plan as documented in the resident's note.

## (undated) NOTE — Progress Notes (Signed)
Formatting of this note is different from the original.  [facility]  Case Management      Patient:   Samuel Fry  MR Number:  [id number]  Patient Date of Birth: March 22, 1991  Age/Sex:  28 y.o./male        Placement update:  No beds [facility] per bed board.  No beds [facility].  [facility]does not accept [facility].  Referral faxed to [facility], [place], [facility], [facility], [place], [place], [place], [place], [place], [address], [place], [place].     Electronically signed:  Venancio Poisson, MSW LCSW  01/14/2020 / 12:27 AM      Electronically signed by Venancio Poisson, MSW LCSW at 01/14/2020 12:29 AM EDT

## (undated) NOTE — ED Notes (Signed)
Formatting of this note might be different from the original.  [facility]  WILL PICKUP PT 01/15/20 @ 8AM  Electronically signed by Kennith Maes, CNA at 01/14/2020  8:30 AM EDT

## (undated) NOTE — ED Notes (Signed)
Formatting of this note might be different from the original.  Pt ambulated to restroom with some assistance, refused to walk back to room, placed self in wheel chair and demanded to be pushed back to room  Electronically signed by Netta Corrigan, RN at 01/12/2020  3:28 PM EDT

## (undated) NOTE — ED Notes (Signed)
Formatting of this note might be different from the original.  Patient is resting in bed with no s/s of pain or distress. Breathing is even,and non labored. VSWDL. No complaints at this time. We will continue to monitor.     Sondra Barges, RN  09/22/16 [phone]  Electronically signed by Sondra Barges, RN at 09/22/2016  3:05 AM CST
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
## (undated) NOTE — ED Notes (Signed)
Formatting of this note might be different from the original.  [facility] ASSESSOR ABS     Hollace Hayward, RN  09/21/16 [phone]  Electronically signed by Hollace Hayward. at 09/21/2016  9:40 PM CST

## (undated) NOTE — Nursing Note (Signed)
Formatting of this note might be different from the original.  Pt admitted to Triage at 2024 on a voluntary basis.Pt prefers to be called called "Samuel Fry," with male pronouns. She was already in [employer] and her contraband check was negative. She was banded and signed "No" to the general ROI. Her belongings were inventoried and all of her personal medications in their bottles were kept with her personal belongings.  Electronically signed by Chauncey Mann, RN, OBHP at 12/18/2021  9:34 PM EDT

## (undated) NOTE — ED Notes (Signed)
Formatting of this note might be different from the original.  Received report from mid-shift RN.Care assumed.    Sondra Barges, RN  09/22/16 [phone]  Electronically signed by Sondra Barges, RN at 09/22/2016 12:27 AM CST
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
## (undated) NOTE — ED Notes (Signed)
Formatting of this note might be different from the original.  Pt is asleep at this time. Respirations are even and unlabored. Pt. In NAD. Will continue to monitor.     Isaias Cowman, RN  09/23/16 [phone]  Electronically signed by Isaias Cowman, RN at 09/23/2016 12:50 AM CST

## (undated) NOTE — Nursing Note (Signed)
Formatting of this note might be different from the original.  Pt is very cheerful, reporting suicidal ideation with a plan to jump off of a bridge.    Electronically signed by Carolynne Edouard., RN at 12/20/2021  1:04 PM EDT

## (undated) NOTE — Progress Notes (Signed)
Formatting of this note might be different from the original.  [name] [facility], returning to live at a shelter, going to [facility] upon discharge. Phone #[phone].   She is currently linked with [facility] for on-going mental health services, and she was provided the following follow up appointments:    Care Coordinator: 4/26 at 10  Clinician: 5/2 at 10:30  MD: 5/12 at 1:45     She reports she does not have a primary care physician, and she  was provided a list of [place] health care clinics. She has Medicare for health care coverage, and she has family support for financial assistance.  She received education on diagnosis, symptoms, medication, and resources via physician, nursing, occupational therapy and social services staff.   Charlynn Grimes, OBHP      Electronically signed by Charlynn Grimes, OBHP at 12/21/2021  5:52 PM EDT
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
## (undated) NOTE — ED Notes (Signed)
Formatting of this note might be different from the original.  Bed: 42  Expected date:   Expected time:   Means of arrival:   Comments:  Triage, psych  Electronically signed by Nelida Meuse, RN at 05/01/2020  1:15 AM EDT

## (undated) NOTE — ED Provider Notes (Signed)
Formatting of this note is different from the original.  Images from the original note were not included.  Encounter Date: 01/13/2021      History     Chief Complaint   Patient presents with   ? Psychiatric Evaluation     Feeling suicidal thoughts     HPI   28 y.o.  BIBEMS for suicidal ideation  Pt texting on phone  Chart review shows ED visit in [place] earlier today  Pt left AMA  Plan to hang self  sts noncompliant with rx'd medication  Denies etoh or drugs    Review of patient's allergies indicates:   Allergen Reactions   ? Peanut Anaphylaxis   ? Geodon [ziprasidone hcl] Rash   ? Bactrim [sulfamethoxazole-trimethoprim]      Makes my hair fall out     Past Medical History:   Diagnosis Date   ? ADD (attention deficit disorder)    ? ADHD    ? Asperger's disorder    ? Autism    ? Bipolar 1 disorder    ? History of psychiatric hospitalization    ? Hx of psychiatric care    ? Psychiatric exam requested by authority    ? Psychiatric problem    ? Seizures    ? Therapy      No past surgical history on file.  No family history on file.  Social History     Tobacco Use   ? Smoking status: Current Every Day Smoker     Packs/day: 1.00     Years: 5.00     Pack years: 5.00     Types: Cigarettes   ? Smokeless tobacco: Never Used   Substance Use Topics   ? Alcohol use: No   ? Drug use: No     Review of Systems  All systems were reviewed/examined and were negative except as noted in the HPI.    Physical Exam     Initial Vitals [01/13/21 2356]   BP Pulse Resp Temp SpO2   133/78 100 (!) 22 98 F (36.7 C) 99 %     MAP       --         Physical Exam    General: the patient is awake, alert, and in no apparent distress.  Head: normocephalic and atraumatic, sclera are clear  Neck: supple without meningismus  Chest: clear to auscultation bilaterally, no respiratory distress  Heart: regular rate and rhythm  ABD soft, nontender, nondistended, no peritoneal signs  Back nt in the midline  Extremities: warm and well perfused  Skin: warm and  dry  Psych conversant  Neuro: awake, alert, moving all extremities    ED Course   Procedures  Labs Reviewed   CBC W/ AUTO DIFFERENTIAL - Abnormal; Notable for the following components:       Result Value    Lymph % 17.5 (*)     All other components within normal limits   COMPREHENSIVE METABOLIC PANEL - Abnormal; Notable for the following components:    Potassium 3.4 (*)     CO2 22 (*)     ALT 58 (*)     All other components within normal limits   URINALYSIS, REFLEX TO URINE CULTURE - Abnormal; Notable for the following components:    Specific Gravity, UA >=1.030 (*)     Bilirubin (UA) 1+ (*)     All other components within normal limits    Narrative:     Specimen Source->Urine  ACETAMINOPHEN LEVEL - Abnormal; Notable for the following components:    Acetaminophen (Tylenol), Serum <3.0 (*)     All other components within normal limits   TSH   DRUG SCREEN PANEL, URINE EMERGENCY    Narrative:     Specimen Source->Urine   ALCOHOL,MEDICAL (ETHANOL)   SARS-COV-2 RNA AMPLIFICATION, QUAL       ECG Results       EKG 12-lead (In process)  Result time 01/14/21 11:09:28    In process by Interface, Lab In [facility] (01/14/21 11:09:28)            Narrative:    Test Reason : Z00.8,    Vent. Rate : 091 BPM     Atrial Rate : 091 BPM     P-R Int : 174 ms          QRS Dur : 084 ms      QT Int : 366 ms       P-R-T Axes : 060 026 026 degrees     QTc Int : 450 ms    Normal sinus rhythm  Normal ECG  When compared with ECG of 06-Nov-2017 22:09,  T wave amplitude has increased in Anterior-lateral leads    Referred By: [email or web address]   SELF           Confirmed By:                    Imaging Results    None        Medications - No data to display      Medical Decision Making:    This is an emergent evaluation of a patient presenting to the ED.  Nursing notes were reviewed.  I personally reviewed, read, and interpreted the ECG and any monitoring strips.  ECG: normal sinus rhythm, no critical findings with intervals, normal rate, and no ischemia.   Compared with prior if available.  Read and interpreted by me independently.    .  I personally reviewed and interpreted the laboratory results.  I decided to obtain and review old medical records, which showed: ED visit    Medical Clearance:    PEC form was completed by me    Based on history, physical, and other data such as laboratory testing, the patient is now medically clear for psychiatric evaluation and treatment.  01/14/2021  2:10 AM    Clovis Cao, MD, [facility]      ED Course as of 01/14/21 1220   Wed Jan 14, 2021   1610 Care transferred from Dr. Katrinka Blazing.  AWaiting transport, medically cleared.  [JS]     ED Course User Index  [JS] John B. Su Grand, MD     Medically cleared for psychiatry placement: 01/14/2021  1:56 AM    Clinical Impression:   Final diagnoses:  [Z00.8] Medical clearance for psychiatric admission  [R45.851] Suicidal ideation (Primary)         ED Disposition Condition    Transfer to Psych Facility        ED Prescriptions     None       Follow-up Information    None      Stable    Clovis Cao, MD, MBA, FACEP  Department of Emergency Medicine      Veverly Fells. Katrinka Blazing, MD  01/14/21 9604      Veverly Fells. Katrinka Blazing, MD  01/14/21 1220    Electronically signed by Gillian Shields., MD at 01/14/2021 12:20 PM CDT

## (undated) NOTE — ED Notes (Signed)
Formatting of this note might be different from the original.  Call received from Kathalene Frames with [employer] at  [phone].  Number given to patient to return call  Electronically signed by Abbey Chatters Ridenhour, RN at 01/14/2020 12:24 PM EDT

## (undated) NOTE — ED Notes (Signed)
Formatting of this note might be different from the original.  Bed: 62  Expected date:   Expected time:   Means of arrival:   Comments:  72  Electronically signed by Cassell Smiles, RN at 05/01/2020  4:55 AM EDT

## (undated) NOTE — ED Notes (Signed)
Formatting of this note might be different from the original.  Ordered dinner tray.      Danella Penton Zachery Dauer, RN  09/23/16 1603  Electronically signed by Malon Kindle., RN at 09/23/2016  4:03 PM CST

## (undated) NOTE — ED Notes (Signed)
Formatting of this note might be different from the original.  Ordered lunch tray.      Danella Penton Zachery Dauer, RN  09/23/16 1046  Electronically signed by Malon Kindle., RN at 09/23/2016 10:46 AM CST

## (undated) NOTE — Progress Notes (Signed)
Formatting of this note is different from the original.  [facility]    Patient:   Samuel Fry  MR Number:  [id number]  Patient Date of Birth: Jan 20, 1991  Age/Sex:  54 y.o./male        Faxed referral to [facility], [facility], [place], [place], [place], [facility] and [place] for review for possible admission.    Electronically signed:  Alveta Heimlich, [facility]  01/13/2020 / 7:58 AM      Electronically signed by Alveta Heimlich, [facility] at 01/13/2020  7:59 AM EDT
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
## (undated) NOTE — ED Provider Notes (Signed)
Formatting of this note is different from the original.    [facility]    ED Provider Note    Samuel Fry 31 y.o. male DOB: 24-May-1991 MRN: [id number]     Medical screening initiated and orders placed by Soledad Gerlach, PA.  01/10/2020 / 6:20 PM    43 y.o. male presents with 2 weeks of suicidal ideations with plan to hang self.    Patient seen and received a screening examination in triage.  Appropriate orders have been initiated based on my brief physical exam and HPI. Patient placed in the sub-waiting area until a treatment room comes available for further evaluation and management in the main ED.    This medical screening exam was electronically signed by Soledad Gerlach, PA on 01/10/2020 at 6:20 PM    History     Chief Complaint   Patient presents with   ? Suicidal     arrived with [employer] he called, states having thoughts of killing himself, with plan of hanging himself, arrived into triage stating, " Lori arriving for killing"     77 year old male presents to the emergency department for evaluation.  The patient is a poor historian, but states that he arrives with police with thoughts to hang himself.  States that this has been going on for the past 2 weeks.  He cannot answer to me whether he has a past history of psychiatric issues or past suicidal attempts.  Denies actually trying to hang himself.  Denies any overdose attempt.  Denies any recent alcohol or drug abuse.  Denies any homicidal ideations.    History provided by:  Patient and police  Language interpreter used: No      No past medical history on file.    No past surgical history on file.    Social History     Substance and Sexual Activity   Alcohol Use Not Currently    Comment: L/U 10 yearsa ago     Social History     Tobacco Use   Smoking Status Current Some Day Smoker   ? Packs/day: 1.00   ? Start date: 01/29/2012   Smokeless Tobacco Never Used     E-Cigarettes   ? Vaping Use Never User    ? Start Date     ? Cartridges/Day     ? Quit  Date       Social History     Substance and Sexual Activity   Drug Use Not Currently    Comment: L/U 10 years ago           Allergies   Allergen Reactions   ? Bactrim [Sulfamethoxazole-Trimethoprim] Other     Make hair fall out   ? Geodon [Kdc:Yellow Dye+Ziprasidone+Brilliant Blue Fcf] Hives   ? Haldol [Haloperidol] Other     Makes pt feel like mouth is pried open   ? Latex Rash   ? Other Other     rash     Home Medications    FLUOXETINE (PROZAC) 20 MG TABLET    Take one tablet (20 mg dose) by mouth daily.    NICOTINE POLACRILEX (NICORETTE) 2 MG GUM    Take one each (2 mg dose) by mouth as needed for Smoking cessation.    PHENYTOIN SODIUM (DILANTIN) 100 MG ER CAPSULE    Take three capsules (300 mg dose) by mouth daily. For 7 days    QUETIAPINE FUMARATE (SEROQUEL) 200 MG TABLET    Take one tablet (  200 mg dose) by mouth at bedtime.    TRAZODONE (DESYREL) 100 MG TABLET    Take one tablet (100 mg dose) by mouth at bedtime.     Review of Systems     Review of Systems   All other systems reviewed and are negative.    Physical Exam     ED Triage Vitals [01/10/20 1819]   BP 145/88   Heart Rate 86   Resp 16   SpO2 99 %   Temp 98.9 F (37.2 C)     Physical Exam   Nursing note and vitals reviewed.  Constitutional: Vitals signs normalHe does not appear distressed and no respiratory distress. Not diaphoretic.  HENT:   Head: Normocephalic. Negative Raccoon sign. No right periorbital ecchymosis and no left periorbital ecchymosis.   Mouth/Throat: Voice normal.   Eyes: EOM are intact. Pupils are equal, round, and reactive to light.   Neck: Normal range of motion and voice normal. Neck supple.   Cardiovascular: Normal rate and regular rhythm.   Pulmonary/Chest: No respiratory distress. Respiratory effort normal.   Abdominal: Soft. There is no abdominal tenderness.   Musculoskeletal: Normal range of motion. No obvious deformity noted to extremities.      Cervical back: Normal range of motion and neck supple.     Neurological: He is  alert. He has normal speech. He exhibits normal muscle tone.   Skin: Not diaphoretic. No petechiae. No cyanosis.   Psychiatric: He has a normal mood and affect. Judgment normal.     ED Course     Lab results:    SALICYLATE LEVEL - Abnormal       Result Value    Salicylate <3.0 (*)    ACETAMINOPHEN LEVEL - Abnormal    Acetaminophen <5.0 (*)    CBC AND DIFFERENTIAL - Abnormal    WBC 4.7 (*)     RBC 6.08 (*)     HGB 17.1      HCT 49.3      MCV 81 (*)     MCH 28.1      MCHC 34.7      Plt Ct 191      RDW SD 38.0      MPV 10.8      NRBC% 0.0      NRBC 0.000      NEUTROPHIL % 63.8      LYMPHOCYTE % 24.9 (*)     MONOCYTE % 9.2      Eosinophil % 1.3      BASOPHIL % 0.6      IG% 0.200      ABSOLUTE NEUTROPHIL COUNT 2.99      ABSOLUTE LYMPHOCYTE COUNT 1.2      MONO ABSOLUTE 0.4      EOS ABSOLUTE 0.1      BASO ABSOLUTE 0.0      IG ABSOLUTE 0.010     COMPREHENSIVE METABOLIC PANEL - Abnormal    Na 138      Potassium 3.9      Cl 103      CO2 23      Glucose 114 (*)     BUN 10      Creatinine 0.76      Ca 9.5      ALK PHOS 88      T Bili 0.23      Total Protein 7.7      Alb 4.1      GLOBULIN 3.6  ALBUMIN/GLOBULIN RATIO 1.1      BUN/CREAT RATIO 13.2      ALT 79 (*)     AST 42 (*)     GFR AFRICAN AMERICAN 146      Comment: African-American:   Normal GFR (glomerular filtration rate) > 60 mL/min/1.73 meters squared. < 60 may include impaired kidney function based on creatinine, age, legal sex, and race normalized to accepted average body surface area     GFR Non African American 126      Comment: Non African-American:   Normal GFR (glomerular filtration rate) > 60 mL/min/1.73 meters squared. < 60 may include impaired kidney function based on creatinine, age, legal sex, and race normalized to accepted average body surface area     AGAP 12      GFR Non African American UNK         Comment: Non African-American UNK/Male:   Normal GFR (glomerular filtration rate) > 60 mL/min/1.73 meters squared. < 60 may include impaired kidney  function based on creatinine, age, legal sex, and race normalized to accepted average body surface area     GFR Non African American UNK        Comment: Non African-American UNK/Male:   Normal GFR (glomerular filtration rate) > 60 mL/min/1.73 meters squared. < 60 may include impaired kidney function based on creatinine, age, legal sex, and race normalized to accepted average body surface area     GFR African American UNK        Comment: African-American UNK/Male:   Normal GFR (glomerular filtration rate) > 60 mL/min/1.73 meters squared. < 60 may include impaired kidney function based on creatinine, age, legal sex, and race normalized to accepted average body surface area     GFR African American UNK        Comment: African-American UNK/Male:   Normal GFR (glomerular filtration rate) > 60 mL/min/1.73 meters squared. < 60 may include impaired kidney function based on creatinine, age, legal sex, and race normalized to accepted average body surface area    ETHANOL - Normal    Ethanol <10      Comment: Blood Alcohol Level is for Medical Purposes Only.   URINE DRUGS OF ABUSE SCRN     Imaging:  No data to display    ECG:  ECG Results    None      HEAR Score      Pre-Sedation  Procedures    ED Course as of Jan 10 2156   [facility] Documentation   Thu Jan 10, 2020   2154 O2 Saturation Physician Interpretation: Stable, no invasive intervention required.    SpO2: 99 %     MDM  Number of Diagnoses or Management Options  Suicidal thoughts: new and requires workup  Diagnosis management comments: Vernis presents to the emergency department for evaluation of suicidal thoughts and wanting to hang himself.  Presents with police.  Patient does admit to the suicidal ideations.  For this reason, I placed patient under involuntary commitment until he can be evaluated formally by psychiatry.  Patient otherwise appears well.  No other symptoms or complaints.  Vital signs stable.  Patient awaiting evaluation by behavioral  health.    Please see "ED Course" section of chart for further documentation of my personal Provider Interpretation of studies performed and O2 saturation.       Amount and/or Complexity of Data Reviewed  Clinical lab tests: ordered and reviewed    Patient Progress  Patient progress: improved  MDM  Reviewed: previous chart, nursing note and vitals  Interpretation: labs    Provider Communication    New Prescriptions    No medications on file     Modified Medications    No medications on file     Discontinued Medications    No medications on file     Clinical Impression     Final diagnoses:   Suicidal thoughts     ED Disposition     ED Disposition Comment    Behavioral Health                 Electronically signed by:    Alvera Singh, DO  01/10/20 2157    Electronically signed by Alvera Singh, DO at 01/10/2020  9:57 PM EDT

## (undated) NOTE — ED Notes (Signed)
Formatting of this note might be different from the original.  Rounding :  ABCs intact    ACTIVITY: Pt resting quietly on cart; Patent airway; Spontaneous breathing with regular respirations.    NEEDS/CONCERNS - Pt asking for his IV to be removed, says it's bothering him.   SAFETY:  Cart in low position, side rails x2 for safety. PT remains in blue gown with continuous video monitoring in place for PT safety. Pt denies HI but reports SI at this time; plan to jump off a bridge. Pt non violent @ present.    Pt falls asleep during conversation and says he's too tired to talk right now.   Electronically signed by Murlean Hark, RN at 05/01/2020  8:37 AM EDT

## (undated) NOTE — ED Notes (Signed)
Formatting of this note might be different from the original.  Assumed patient care.   Pt sleeping on stretcher in NAD, respirations even and unlabored. Stretcher locked and in lowest position, side rails x 2. Security at the bedside. Will continue to monitor and update pt on plan of care.    Electronically signed by Simon Rhein, RN at 01/14/2021  7:24 AM CDT

## (undated) NOTE — ED Notes (Signed)
Formatting of this note might be different from the original.  Pt to ed with co left sided chest pain that started while he was at a bar smoking cigarettes. Pt denies drinking or doing anything strenuous. Pt ambulatory to ed 5 escorted by triage nurse NADN. Pt denies any other symptoms at this time   Electronically signed by Aundra Dubin, RN at 02/08/2019  4:45 AM CDT

## (undated) NOTE — ED Notes (Signed)
Formatting of this note might be different from the original.  Changed gowns  Electronically signed by Netta Corrigan, RN at 01/13/2020  8:49 AM EDT
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
## (undated) NOTE — Nursing Note (Signed)
Formatting of this note might be different from the original.  Printed and reviewed AVS with pt. Pt verbalized understanding. Pt provided case management f/u appt 12/23/21 1000 at [facility]. Belongings reviewed and returned, including home medications- see property sheet. Denies SI/HI. Copy of safety plan sent with pt. Pt provided cab at discharge.  Electronically signed by Georga Kaufmann., RN, OBHP at 12/21/2021  2:51 PM EDT

## (undated) NOTE — ED Provider Notes (Signed)
Formatting of this note is different from the original.  EMERGENCY DEPARTMENT  -  VISIT NOTE    ------------------------------ HISTORY OF PRESENT ILLNESS --------------------------  Chief Complaint   Patient presents with    Feeling depressed    Suicidal Ideations     Pt c/o suicidal thoughts. Sts he left [place] today. Sts he wants to go to crisis stabilization unit     Interpreter: not needed - patient preferred language is [place].    The history is provided by the Patient.   Samuel Fry is a 89 year old male with a PMH of bipolar disorder, anxiety, depression, seizures, presenting to the ED for suicidal ideations.  Patient mentioned that for the past 3-4 days he is experiencing increased suicide ideation without plan or intent.  Endorses increased thoughts depression/anxiety.  Believe his Prozac was decreased to 10 mg but is unsure what it was prior to this.  Denies any homicidal ideation, audiovisual hallucinations.  Would like to be admitted to crisis stabilization Center.  Denies any ETOH, drug use, smoking.  Denies any chest pain, shortness of breath, fever, chills, nausea, vomiting, lightheadedness, dizziness, syncope, dysuria.    Review of External (Non-[facility] ED) Notes: [facility] ED notes reviewed and show -     Samuel Fry was admitted on 12/18/2021 under Voluntary status. She was placed on no precautions. She presented initially with symptoms of suicidal ideation. She was treated with her home medications of prozac, seroquel, phenytoin, prazosin, trazodone in order to stabilize symptoms. Risks, benefits, and side effects of medications were reviewed with her. In addition, she was provided routine PRN medications. Her behavior on the unit required no restraints and no seclusion. She was also provided group therapy, recreational therapy, and occupational therapy. She was seen daily by her treatment team. With these interventions, her symptoms and functioning improved so that she was  stable for discharge. At the time of discharge her symptoms had improved such that she denied any suicidal thoughts and stated her mood was "happy". She was eager to get to the [facility] in order to pick up mail. The patient was not having substantial difficulty with sleep, appetite, or energy. Psychiatric commitment was not pursued due to voluntary participation in treatment.     ----------------------------------- REVIEW OF SYSTEMS -------------------------------------  Review of Systems  Negative unless otherwise noted in HPI    --------------------------------------- PAST HISTORY ------------------------------------------  Pertinent Past History:   No past medical history on file.  There is no problem list on file for this patient.      Pertinent Social History:  See HPI    ------------------------------------- PHYSICAL EXAM -------------------------------------------  BP 133/78   Pulse 82   Temp 99.5 F (37.5 C) (Oral)   Resp 16   SpO2 99%      GEN: NAD, nontoxic  HENT: atraumatic  EYES: EOMI, anicteric  NECK: trachea midline  CV: warm, well perfused, regular rate and rhythm   PULM: regular rate, normal work of breathing  ABD: soft, non-distended  EXT: no edema or gross deformities  NEURO: alert, oriented no focal deficits, moving all four extremities purposefully   PSYCH: appropriate mood and affect, thoughts of depression, anxiety, endorsing SI, denies HI, denies AVH    ----------------- MEDICAL DECISION MAKING and ED COURSE ---------------------   Nursing triage and assessment notes reviewed and incorporated.    Evaluated by EM attending Arlyss Queen     Course:   ED Course as of 04/16/22 0150   Thu Apr 15, 2022   2118 BP: 133/78 [PT]   2118 Temperature: 99.5 F (37.5 C) [PT]   2118 Heart Rate: 82 [PT]   2118 Respiratory Rate: 16 [PT]   2118 SpO2: 99 % [PT]   2118 O2 Device: Room air [PT]   Fri Apr 16, 2022   0007 COMPLETE BLOOD COUNT W/DIFF (RAPID RESPONSE LABS):    WBC 4.9   RBC 5.76   Hemoglobin  16.3   Hematocrit 47.9   MCV 83   MCH 28.3   MCHC 34.1   Platelet 186   RDW-CV% 13.9   MPV 9.3   Neutrophils 60.6   Neutrophil # 2.96   Lymphocytes 26.1   Lymph Absolute 1.27   Monocytes 10.9   Monocyte Absolute 0.53   Eosinophil 1.7   Eosinophil Absolute 0.08   Basophils 0.7   Basophil # 0.04  No leukocytosis, hgb at baseline, no thrombocytopenia.    [PT]   0007 BMP (CHEM 8)(!):    Glucose 86   Sodium 139   Potassium 3.8   Carbon Dioxide 26   Chloride 104   BUN 7(!)   Creatinine 0.81   Calcium 9.4   Anion Gap 13   Estimated GFR 123  No renal dysfunction, no clinically significant electrolyte derangement.   [PT]   0007 URINE TOXICOLOGY SCREEN:    Amphetamines Negative   BarbituateS Negative   Methadone Negative   Opiate Negative   Oxycodone, Urine Negative   Fentanyl Negative   Hydrocodone Negative   BenzodiazapineS Negative   Cocaine Class Negative   Phencyclidine Negative   THC Class Negative   Buprenorphine Negative   Alcohol Negative  negative [PT]   0007 URINALYSIS:    Color Light Yellow   Appearance Clear   pH 5.5   Spec Gravity 1.019   Protein Negative   Blood Negative   Bilirubin Negative   Urobilinogen Negative   Ketones Negative   Leuk. Esterase Negative   Nitrite Negative   Glucose Negative  No evidence of UTI   [PT]   0007 ETOH (ALCOHOL) LEVEL:    Ethanol <10  wnl [PT]     ED Course User Index  [PT] [name], MD     Assessment & Plan:     Patient is a 10 year old male with a past medical history as noted above presenting to the ED for suicidal ideations.  The patient is presenting hemodynamically stable and in no acute distress, afebrile.  Physical exam as noted above.  Lab work without any acute abnormalities requiring intervention.The patient is otherwise medically cleared.  Psychiatry was consulted regarding patient's presentation who are recommending Seroquel 200 mg, Dilantin 300 mg now and to continue on home meds which patient has home prescriptions.  Social work team provided assistance for  placement in homeless shelter.  Psychiatry team suggesting patient does not meet inpatient psychiatric criteria at this time.  Discussed findings with the patient who is agreeable with plan and discharged to homeless shelter with outpatient follow up.  Discussed return precautions and advised close follow up with PCP/psychiatry for which patient voiced understanding.  Patient was discharged with appropriate follow up.    ------------------------------ IMPRESSION AND DISPOSITION ----------------------------  Clinical Impression       Diagnosis Comment    Suicidal ideation [R45.851]            Disposition: Home    The patient has received a medical screening examination and within reasonable clinical confidence an emergency medical condition has  not been identified.    Counseling: Spoke with the patient and discussed today?s findings, in addition to providing specific details for the plan of care and expected course.  They were given the opportunity to ask questions.    Discussed return precautions and importance of follow-up.  Advised to follow-up with PCP/psychiatry.  Advised to return to the ED for changing or worsening symptoms, new symptoms, complaint specific precautions, and precautions listed on the discharge paperwork.    This note was created with the assistance of speech recognition software.    [name], MD   Emergency Medicine, PGY-2    Electronically signed by Arlyss Queen, MD at 04/17/2022  3:00 AM EDT    Associated attestation - Arlyss Queen, MD - 04/17/2022  3:00 AM EDT  Formatting of this note might be different from the original.  ATTENDING NOTE    I saw and evaluated the patient. I personally obtained the key and critical portions of the history and physical exam. I reviewed the resident's documentation and discussed the patient with the resident. I agree with the resident's medical decision making as documented in the resident's note.     Arlyss Queen, MD

## (undated) NOTE — H&P (Signed)
Formatting of this note is different from the original.    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    FULL EVALUATION: 02/17/2021    REFERRAL SOURCE: Self  ACCOMPANIED BY: N/A  SOURCE OF HISTORY: Patient  DIFFICULTY OBTAINING HISTORY: N/A  INTERPRETER (IF APPROPRIATE): N/A       SUBJECTIVE     HISTORY OF PRESENT ILLNESS:  Patient was asleep snoring when writer went to see him. Arousable. States he's been raised in foster care in [place], has been homeless for 2 years, and has no family support other than mother who is "homophobic and makes me feel bad about myself." Has been traveling various states and spending time at various MH facilities, most recently at [facility] for 1 week until he left AMA 1 day ago due to "them being homophobic and treating me badly." Patient states he went to [facility] ED 1 day ago and asked to be admitted because he was "suicidal, because I did not have anywhere to go." He was sent to [facility] and they did not take him because he did not have any ID on him, so he took a bus to [facility]. Patient states he came here because he's "suicidal," denies plan or intent to hurt himself currently, states he's been "suicidal" for "months, years, every day," cannot articulate the SI beyond "just suicidal." Patient cannot recall the last time w/o SI, states SI has "never" gone away and is always there, denies any change in SI when he's been IP psych, and confirms his chronic SI has not changed or worsened recently. Patient later states he came  here because he does not have a place to sleep and reports he'd be able to keep himself safe/not hurt himself he can go somewhere he can sleep such as a shelter or CDF.    Mood: "better" Reports difficulty sleeping and access to food related to current situation of being unhoused, reports chronic SI as above w/ no recent change or ever having improvement in IP environment, denies manic or psychotic symptoms including AVH, paranoia, and delusions.     Patient requests restraints to be loosened due to it being too tight (in restraints due to ED policy for patients c/o SI). Also requests food/beverages. Observed to be interacting w/ myself and others in a calm and cooperative manner. Later asked SW about [facility] shelter and more food, receives a bus ticket and resources, and denies any thoughts about self harm or safety concerns.    HISTORY     Psych Treatment History:  Dx: reported dx of BPAD and ASD  IP: reports 20+ in lifetime "because I'm suicidal because I don't have a place to sleep", most recently voluntary at [facility] 6/14-20 for chronic SI (left AMA due to "they were treating me badly, making homophobic comments). Prior to that, was at IP facility in [place] for chronic SI (left AMA due to "being treated badly")  OP: seroquel 300mg   qhs, trazodone 200mg  qhs, and prozac 40mg  daily. Reports this regimen has not changed for 2 years - says he does not have a regular OP psychiatrist but gets refills from various psych meds in different states he travels to  SA: x1, tried to hang himself at age 13, declines further details  Violence to others: denies    Substance History  Denies any hx of use of alcohol/drugs. "Never"    Social History  Grew up in foster care in [place]. "Homeless" has been moving from state to state, took a Engineer, maintenance (IT) from [place] to [place] earlier this month because he heard good things about [place] area  Mom incarcerated in [place] and "homophobic, she makes me feel bad about  myself"  Has an ex-partner in [place] - recently broke up but trying to get back together, has plans to travel to [place] once he gets his ID    Denies family psych hx  Reports medical hx of seizure d/o on dilantin  Denies surgical hx        Is the patient a veteran?   []  Yes. Are they eligible for care at the [facility]?    []  Yes.      []  No      []  Unsure   [x]  No    [x]  See ED note on 6/21 for Review of Systems    Review of Systems    OUTPATIENT MEDICATIONS:   No current outpatient medications    ALLERGIES:   Bactrim [sulfamethoxazole-trimethoprim], Geodon [ziprasidone], Haloperidol, and Latex        OBJECTIVE     Vitals (Arrival)      T: 36.7 C (02/17/21 0046)  BP: 128/60 (02/17/21 0046)  HR: 92 (02/17/21 0046)  RR: 16 (02/17/21 0046)  SpO2: 95 % (02/17/21 0046)     Vitals (Most recent in last 24 hrs)   T: 36.7 C (02/17/21 0046)  BP: 128/60 (02/17/21 0046)  HR: 92 (02/17/21 0046)  RR: 16 (02/17/21 0046)  SpO2: 95 % (02/17/21 0046) Room air  T range: Temp  Min: 36.3 C  Max: 36.7 C  (no weight taken for this visit)     (no height taken for this visit)     There is no height or weight on file to calculate BMI.     [x]  See ED note on 6/21 for Physical Exam    Physical Exam    MENTAL STATUS EXAM  Appearance: in restraints, appropriate for his situation   Behavior: Cooperative  Speech: Appropriate Rate and Volume  Mood: ok  Affect: mood-congruent  Thought process: Linear  Thought content: No auditory or visual hallucinations  Suicidal ideation: reports he's had "suicidal thoughts" for years, states the thoughts never goes away, reports for past 2 days the SI has been due to not having a place to sleep, denies having a plan or intent for SI currently  Homicidal Ideation: Denies   Orientation:  Oriented to time, person, place and situation  Attention:  Fair  Memory:  Normal  Insight:  Limited  Judgment: Limited    LABS:  Recent Results (from the past 36 hour(s))   Acetaminophen Level    Collection Time: 02/16/21  4:56 PM    Result Value Ref Range    Acetaminophen (Tylenol) <10 0 - 25 ug/mL   Alcohol, Ethyl, Blood    Collection Time: 02/16/21  4:56 PM   Result Value Ref Range    Alcohol (Ethyl) Negative NRN mg/dL   CBC  Collection Time: 02/16/21  4:56 PM   Result Value Ref Range    WBC 6.40 4.3 - 10.0 10*3/uL    RBC 5.73 (H) 4.40 - 5.60 10*6/uL    Hemoglobin 16.3 13.0 - 18.0 g/dL    Hematocrit 46 16.1 - 50.0 %    MCV 81 81 - 98 fL    MCH 28.4 27.3 - 33.6 pg    MCHC 35.4 32.2 - 36.5 g/dL    Platelet Count 096 045 - 400 10*3/uL    RDW-CV 12.5 11.6 - 14.4 %   Comprehensive Metabolic Panel    Collection Time: 02/16/21  4:56 PM   Result Value Ref Range    Sodium 136 135 - 145 meq/L    Potassium 3.8 3.6 - 5.2 meq/L    Chloride 102 98 - 108 meq/L    Carbon Dioxide, Total 27 22 - 32 meq/L    Anion Gap 7 4 - 12    Glucose 80 62 - 125 mg/dL    Urea Nitrogen 15 8 - 21 mg/dL    Creatinine 4.09 8.11 - 1.18 mg/dL    Protein (Total) 7.1 6.0 - 8.2 g/dL    Albumin 4.1 3.5 - 5.2 g/dL    Bilirubin (Total) 0.3 0.2 - 1.3 mg/dL    Calcium 9.6 8.9 - 91.4 mg/dL    AST (GOT) 21 9 - 38 U/L    Alkaline Phosphatase (Total) 71 35 - 109 U/L    ALT (GPT) 31 10 - 64 U/L    eGFR by CKD-EPI >60 >59 mL/min/[1.73_m2]   Emergency Drug Screen, Urine    Collection Time: 02/16/21  5:25 PM   Result Value Ref Range    Cannabinoids Qual, URN Negative NRN    Phencyclidine Qual, URN Negative NRN    Cocaine Qual, URN Negative NRN    Methamphetamine Qual, URN Negative NRN    Opiates Qual, URN Negative NRN    Amphetamine (Qual), URN Negative NRN    Benzodiazepines Qual, URN Negative NRN    Tricyclic Antidepressants Qual, URN Positive (A) NRN    Methadone Qual, URN Negative NRN    Barbiturate Qual, URN Positive (A) NRN    Oxycodone Qual, URN Negative NRN    Drug Screen Test Info, URN SEE NOTES    SARS-CoV-2 (COVID-19) Qualitative Rapid PCR    Collection Time: 02/17/21  1:02 AM   Result Value Ref Range    COVID-19 Coronavirus Qual PCR Specimen Type Nasal swab     COVID-19  Coronavirus Qual PCR Result None detected NDET    COVID-19 Coronavirus Qual PCR Interpretation       This is a negative result. Laboratory testing alone cannot rule out infection, particularly in the presence of clinical risk factors such as symptoms or exposure history.    COVID-19 Qualitative PCR Indication Admission Surveillance    CBC with Diff    Collection Time: 02/17/21  1:16 AM   Result Value Ref Range    WBC 5.80 4.3 - 10.0 10*3/uL    RBC 5.55 4.40 - 5.60 10*6/uL    Hemoglobin 16.0 13.0 - 18.0 g/dL    Hematocrit 46 78.2 - 50.0 %    MCV 83 81 - 98 fL    MCH 28.8 27.3 - 33.6 pg    MCHC 34.6 32.2 - 36.5 g/dL    Platelet Count 956 213 - 400 10*3/uL    RDW-CV 12.6 11.6 - 14.4 %    % Neutrophils 61 %    %  Lymphocytes 27 %    % Monocytes 9 %    % Eosinophils 2 %    % Basophils 1 %    % Immature Granulocytes 0 %    Neutrophils 3.59 1.80 - 7.00 10*3/uL    Absolute Lymphocyte Count 1.55 1.00 - 4.80 10*3/uL    Monocytes 0.51 0.00 - 0.80 10*3/uL    Absolute Eosinophil Count 0.10 0.00 - 0.50 10*3/uL    Basophils 0.04 0.00 - 0.20 10*3/uL    Immature Granulocytes 0.01 0.00 - 0.05 10*3/uL    Nucleated RBC 0.00 0.00 10*3/uL    % Nucleated RBC 0 %   Comprehensive Metabolic Panel    Collection Time: 02/17/21  1:16 AM   Result Value Ref Range    Sodium 137 135 - 145 meq/L    Potassium 3.7 3.6 - 5.2 meq/L    Chloride 103 98 - 108 meq/L    Carbon Dioxide, Total 26 22 - 32 meq/L    Anion Gap 8 4 - 12    Glucose 101 62 - 125 mg/dL    Urea Nitrogen 15 8 - 21 mg/dL    Creatinine 1.61 0.96 - 1.18 mg/dL    Protein (Total) 6.9 6.0 - 8.2 g/dL    Albumin 3.9 3.5 - 5.2 g/dL    Bilirubin (Total) 0.3 0.2 - 1.3 mg/dL    Calcium 9.3 8.9 - 04.5 mg/dL    AST (GOT) 18 9 - 38 U/L    Alkaline Phosphatase (Total) 64 35 - 109 U/L    ALT (GPT) 30 10 - 64 U/L    eGFR by CKD-EPI >60 >59 mL/min/[1.73_m2]   Alcohol, Ethyl, Blood    Collection Time: 02/17/21  1:16 AM   Result Value Ref Range    Alcohol (Ethyl) Negative NRN mg/dL   1st Extra Gold Top     Collection Time: 02/17/21  1:16 AM   Result Value Ref Range    1st Extra Gold Top Additional collection tube    1st Extra Pearl Top    Collection Time: 02/17/21  1:16 AM   Result Value Ref Range    1st Extra Pearl Top Additional collection tube    2ND EXTRA PEARL TOP    Collection Time: 02/17/21  1:16 AM   Result Value Ref Range    2nd Extra Pearl Top Additional collection tube        METABOLIC MONITORING:   No results found for: A1C  No results found for: LIPID       ASSESSMENT/PLAN   Thor Segar is a 69 year old male with 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 On my eval, patient reports chronic SI on a daily basis for years with no recent worsening, denies plan/intent to hurt himself, is able to articulate his main issue past 2 days is a lack of shelter, is not homicidal/manic/psychotic. Patient reports he'd be able to keep himself safe outside ED, is able to articulate plan to continue his psych meds/follow up OP (NDA at [facility] on Wed 12:30, arranged by [facility] ED SW on 6/20)/go to a shelter if they can  take him w/o ID. Therefore, no indication for DCR referral.    SUICIDE RISK ASSESSMENT  Suicide Ideation Categorization: Low: No SI or only presence of passive death wishes  Risk Factors include: SI, History of suicide attempt(s), Psych admit within prior year and Significant recent loss or negative event  Lethal Means Available: No  Attempted to remove lethal means: Other: n/a  Protective/Mitigating Factors: Willingness to follow crisis plan  Dangerousness/Risks: Not a danger to self or others  Overall Suicide Assessement (consider chronic & acute risk & protective factors):  Chronically elevated risk due to chronic SI, history of SA at age 13, and social situation (unhoused, lack of social support) vs. Acute risk low as no worsening in chronic SI, lack of current plan or intent, future-oriented, help-seeking behavior, willingness to follow crisis plan    DIAGNOSES:    There are no problems to display for this patient.    Admit/Discharge Rating Scale  Behavior/Activity Level   1. Uncooperativeness (treatment non-compliance): 0 - None   2. Hostility/Aggression:0 - None  Speech   3. Speech/Communication Disorder:0 - None  Affect   4. Excitement:0 - None  Mood   5. Elevated mood:0 - None   6. Depressed mood:0 - None   7.Anxious mood:0 - None  Thought Content   8. Psychosis:0 - None   9. Suicidality:3 - Moderate  Insight and Judgment   10. Psychiatric insight/Judgement:6 - Severe     PLAN / RECOMMENDATIONS:  - No indication for DCR referral/IP psych.  - Shelter resources provided by [facility] [employer] SW.  - Patient knows his meds & doses as documented above and reports having meds on him from recent IP admission. Patient to continue psychiatric care via NDA at [facility] on Wednesday 12:30 as arranged by [facility] SW on 6/20 - address and phone entered into d/c instruction given to pt.  - Patient to call 911 or return to nearest ED for safety concerns or worsening in symptoms.    Electronically signed by Joaquin Bend, MD at 02/17/2021  6:01 AM PDT

## (undated) NOTE — ED Notes (Signed)
Formatting of this note might be different from the original.  Rounding Assessment:    Activity - Resting in bed.  CardioRespir - Spontaneous, equal chest rise. Respirs unlabored.  Skin - Warm and Dry.  Needs/Concerns - Updated on POC. Denies any further needs at this time.  Safety - Bed in lowest position. Call light within reach. Wheels locked. Side rails raised. Continuous monitoring in place.    Electronically signed by Cassell Smiles, RN at 05/01/2020  6:46 AM EDT

## (undated) NOTE — Nursing Note (Signed)
Formatting of this note might be different from the original.  Samuel Fry is 65 y.o admitted voluntary to [facility] from [facility] Triage. Prefers to be called ''[name]'' and goes by she/her pronouns. Patient sated that she is here due to suicidal ideations with a plan to jump off a bridge. Denies SI/HI/VH at this time but reports hearing voices and feeling depressed. Patient appears calm and cooperative, unit/room tour completed. No c/o pain/discomfort voiced at this time. Ligature alarm checked and functional patient is being monitored every 15 minutes per ordered precautions for safety.  Electronically signed by Roanna Banning., RN at 12/19/2021  1:47 AM EDT

## (undated) NOTE — Progress Notes (Signed)
Formatting of this note is different from the original.  [facility] Psychiatry - ED Consult    Date of Service: 01/12/2020  Referral Source: Emergency Department  Record Review: moderate  Assessment     Psychiatric Diagnoses:  Principal Problem:    Bipolar affective disorder, current episode mixed (*)  Active Problems:    Asperger's syndrome    ADHD    Medical Diagnoses:      Formulation and MDM: Client endorses sucidal ideation,a plan to hang himself,and reports intentions to do so.  Client reports a recent discharge from a behavioral health facility about 2 days ago.  Client reports recurring thoughts of death. Client denies homicidal ideation, plans, or attempts    The patient has been evaluated and determined to be medically stable by the ED provider.  Patient has been assessed by the ED [facility] Therapist and the findings have been discussed with this provider.  Psychiatry was consulted to assist with psychiatric assessment and treatment/disposition planning.  The chart has been reviewed and pertinent findings are noted below. Based on this review and assessment, the treatment plan has been created and discussed with the treatment team.     Based on my assessment, patient requires psychiatric hospitalization due to risk of self injury.    Safety Assessment:  Individualized risk factors include: hopelessness and impulsiveness.  Individualized protective factors include: patient has treatable psychiatric disorders and symptoms.  Taking the aforementioned non-modifiable and modifiable risk factors in conjunction with his protective factors, the patient is currently felt to be of low imminent risk of harm to self.  To further mitigate risk, please see the below treatment recommendations.    Treatment Plan & Recommendation     - Disposition:   - Seek Inpatient Psychiatric Hospitalization  - Commitment Status: Involuntary  - Precautions:   - suicide  - Pertinent Labs:   - UDS negative   Chem profile reviewed:      ALT 79   AST 42  - Psych Med Recs:   - Prozac 20mg  po daily for depression, Seroquel 200mg  po q hs for mood/ruminations/insomnia.   - Medical Recs:   -     Chief Complaint     Suicidal with plan to hang self.     History of Present Illness     Samuel Fry is a 12 y.o. male with a history of  who presented to the ED for Client endorses sucidal ideation,a plan to hang himself,and reports intentions to do so.  Client reports a recent discharge from a behavioral health facility about 2 days ago.  Client reports recurring thoughts of death. Client denies homicidal ideation, plans, or attempts    On interview:  He reports having suicidal ideation, feeling put down by his family, lacking therapy right and his medications not helping. He reports better sleep overnight, good appetite. He reports feeling hopeless.   He wants to move to [place] and start his life over. He is calm and cooperative on interview.    Review Of Systems:  A complete review of systems of the following systems was conducted (Constitutional, Psychiatric, Neurological, Musculoskeletal, Eyes, Gastrointestinal, Cardiovascular, Respiratory, Skin, and Endocrine). All reviewed systems are negative except pertinent positives identified in the HPI.    Past Psychiatric History     Previous diagnoses:Bipolar disorder, Autism (Asperger's), ADHD  Previous psychiatric medication trials:prozac, trazodone, dilantin, seroquel  Past suicidal/homicidal ideation/attempt: denies attempts  Current/Past psychiatric provider:none current, formerly Dr. B with [facility]  Previous psychiatric hospitalizations/Rehab:[facility] in [place] - pt clarifies this was a medical hospitalization    Past Medical History     Past Medical History:   Diagnosis Date   ? ADHD    ? Asperger's disorder    ? Bipolar disorder (*)      Substance Use History     Marijuana: Denies  Cocaine: Denies  Opiates: Denies  Stimulants:  Denies  Benzodiazepine: Denies  Tobacco:  Smoker 1 pack a day  Alcohol: Denies  Other illicit drug usage: Denies    Patient denies all other substance use except for what is listed above.    Readiness for substance/alcohol abuse treatment, if applicable: Not applicable    Family History     Family history of suicide? Unknown    Family History   Family history unknown: Yes     Social History     Access to firearms: pt denies    Social History     Socioeconomic History   ? Marital status: Single     Spouse name: Not on file   ? Number of children: Not on file   ? Years of education: Not on file   ? Highest education level: Not on file   Occupational History   ? Not on file   Tobacco Use   ? Smoking status: Current Some Day Smoker     Packs/day: 1.00     Start date: 01/29/2012   ? Smokeless tobacco: Never Used   Substance and Sexual Activity   ? Alcohol use: Not Currently     Comment: L/U 10 yearsa ago   ? Drug use: Not Currently     Comment: L/U 10 years ago   ? Sexual activity: Not on file   Other Topics Concern   ? Not on file   Social History Narrative   ? Not on file     Social Determinants of Health     Financial Resource Strain:    ? Difficulty of Paying Living Expenses:    Food Insecurity:    ? Worried About Programme researcher, broadcasting/film/video in the Last Year:    ? Barista in the Last Year:    Transportation Needs:    ? Freight forwarder (Medical):    ? Lack of Transportation (Non-Medical):    Physical Activity:    ? Days of Exercise per Week:    ? Minutes of Exercise per Session:    Stress:    ? Feeling of Stress :    Social Connections:    ? Frequency of Communication with Friends and Family:    ? Frequency of Social Gatherings with Friends and Family:    ? Attends Religious Services:    ? Active Member of Clubs or Organizations:    ? Attends Banker Meetings:    ? Marital Status:    Intimate Partner Violence:    ? Fear of Current or Ex-Partner:    ? Emotionally Abused:    ? Physically Abused:    ?  Sexually Abused:      Evaluation     Vitals:   Vitals:    01/12/20 0934   BP: 119/75   Pulse: 66   Resp: 18   Temp: 98.5 F (36.9 C)   SpO2: 100%     Medications:    Allergies:  Allergies   Allergen Reactions   ? Bactrim [Sulfamethoxazole-Trimethoprim] Other     Make hair  fall out   ? Geodon [Kdc:Yellow Dye+Ziprasidone+Brilliant Blue Fcf] Hives   ? Haldol [Haloperidol] Other     Makes pt feel like mouth is pried open   ? Latex Rash   ? Other Other     rash     Labs:  Recent Results (from the past 24 hour(s))   COVID-19, NAA/PCR Nasopharyngeal Swab Nasopharyngeal Swab    Collection Time: 01/11/20 10:53 AM   Result Value Ref Range    SARS-COV-2 Not Detected Not Detected     Mental Status Evaluation     Constitutional:    General Appearance Wearing hospital scrubs and normal appearance    General Behavior Irritated, Defensive and Withdrawn   Musculoskeletal:    Gait and Station In bed for duration of assessment    Strength and tone Normal   Psychiatric:    Psychomotor Activity No motor abnormalities   Speech  Soft   Mood  Depressed   Affect  Flat and Depressed   Thought Process Circumstantial   Associations Intact association   Thought Content/Perceptual Disturbances Evidence of:  Ruminations  Suicidal ideation   Cognition/Sensorium  AAOx4; Memory, attention, language, and fund of knowledge intact    Insight  Poor   Judgment Poor     Electronically signed by:  Vania Rea, MD  01/12/2020 10:34 AM      Electronically signed by Vania Rea, MD at 01/12/2020 10:43 AM EDT

## (undated) NOTE — ED Notes (Signed)
Formatting of this note might be different from the original.  Received phone call from [facility] ETA 45- 60 min for reeval.    Toya Smothers, RN  09/23/16 1344  Electronically signed by Toya Smothers, RN at 09/23/2016  1:44 PM CST

## (undated) NOTE — Progress Notes (Signed)
Formatting of this note is different from the original.  [facility] [employer] [facility]        [facility]  [facility] Psychiatry - Tele-Behavioral Placement and Disposition Note    Patient Name:  Nasiir Scala  Date of Birth:  Jan 26, 1991   Today's Date:  Jan 14, 2020  Disposition     - Disposition Recommendation: Inpatient psych admission  - Admission Type: Involuntary  - Accepting Facility Name/Address: [facility] [Adult] ([address] [place], [place])  - Accepting Facility Report phone number: RN report number is [phone]  - Accepting Psychiatrist Name: Dr. Estill Cotta  - Assigned Room Number: Main campus  - Guardian Contacted: N/A  - Disposition Comments: [facility] ED informed of acceptance.    Electronically signed by:  Alveta Heimlich, [facility]  01/14/2020 8:07 AM      Electronically signed by Alveta Heimlich, [facility] at 01/14/2020  8:08 AM EDT

## (undated) NOTE — ED Notes (Signed)
Formatting of this note might be different from the original.  Pt to hall repeatedly. Pt anxious and restless at this time. Fixated on taking a shower and obtaining a chux for bed. To hall again. Requests a chair because he is weak and does not want to stand, but does not want to sit in bed without a chux placed. Pt pacing hall repeatedly without displaying weakness.   Electronically signed by Denzil Magnuson, RN at 05/01/2020  1:54 PM EDT

## (undated) NOTE — ED Notes (Signed)
Formatting of this note might be different from the original.  p-t belongings in locker 14 two bags  Electronically signed by Theresa Mulligan at 01/10/2020  6:49 PM EDT

## (undated) NOTE — ED Provider Notes (Signed)
Formatting of this note is different from the original.  [facility]    ED Progress Note   Patient was seen an examined this am on morning 0600 rounds.  Patient remains medically stable for behavioral health care. Patient discussed with nursing staff.     Events of the past 24 hours include: Patient has been here less than 24 hours.  He is awaiting assessment this morning.  He has had no change in his status in the last few hours.  He still has not yet provided a urine sample.    Physical examination:    BP 112/70 (BP Location: Left arm, Patient Position: Lying)   Pulse 73   Temp 97.9 F (36.6 C) (Oral)   Resp 20   Ht 1.803 m (5\' 11" )   Wt 128.4 kg (283 lb)   SpO2 99%   BMI 39.47 kg/m     Obese male currently sleeping soundly on his back.  Normal chest rise and fall with unlabored respirations.  Has warm and dry skin.  Normal pulse.  No apparent deficits.    Assessment: Patient continues to remain medically stable and in need of mental health treatment.      Plan:  Current medical issues being addressed in the ED include: None new or acute.    Plan for psychiatric care will be deferred to the mental health team.    No current facility-administered medications for this encounter.    Current Outpatient Medications:   ?  FLUoxetine (PROZAC) 20 MG tablet, Take one tablet (20 mg dose) by mouth daily., Disp: 30 tablet, Rfl: 0  ?  QUEtiapine fumarate (SEROQUEL) 200 mg tablet, Take one tablet (200 mg dose) by mouth at bedtime., Disp: 30 tablet, Rfl: 0  ?  traZODone (DESYREL) 100 MG tablet, Take one tablet (100 mg dose) by mouth at bedtime., Disp: 30 tablet, Rfl: 0  ?  nicotine polacrilex (NICORETTE) 2 mg gum, Take one each (2 mg dose) by mouth as needed for Smoking cessation., Disp: 20 each, Rfl: 0  ?  phenytoin sodium (DILANTIN) 100 mg ER capsule, Take three capsules (300 mg dose) by mouth daily. For 7 days, Disp: 90 capsule, Rfl: 0    Electronically Signed by:    Baldo Ash, MD  01/11/20  [phone]    Electronically signed by Baldo Ash, MD at 01/11/2020  6:46 AM EDT

## (undated) NOTE — ED Notes (Signed)
Formatting of this note might be different from the original.  BH assessment in progress  Electronically signed by Roney Mans, RN BSN at 01/10/2020 10:05 PM EDT

## (undated) NOTE — ED Notes (Signed)
Formatting of this note might be different from the original.  Pt is asleep at this time. Pt. In nad. Pt being observed by sitter, samantha. Pt respirations are even and unlabored.     Isaias Cowman, RN  09/23/16 [phone]  Electronically signed by Isaias Cowman, RN at 09/23/2016  5:33 AM CST

## (undated) NOTE — ED Triage Notes (Signed)
Formatting of this note might be different from the original.  Pt arrived wi c/o SI plan to hang himself. States he has hung himself in the past. PMH: Bipolar, autism, PTSD. Calm and cooperative in triage.  Electronically signed by Steward Drone, RN at 02/17/2021 12:49 AM PDT

## (undated) NOTE — Progress Notes (Signed)
Formatting of this note is different from the original.  [facility]    Patient:   Samuel Fry  MR Number:  [id number]  Patient Date of Birth: 12-04-1990  Age/Sex:  28 y.o./male        Faxed updated clinicals to [place], [place], [address], [address], [address], [place], [place], [place], [address], [address], [place], [place] and [place] for review.    Electronically signed:  Alveta Heimlich, [facility]  01/14/2020 / 7:13 AM      Electronically signed by Alveta Heimlich, [facility] at 01/14/2020  7:15 AM EDT
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
## (undated) NOTE — ED Notes (Signed)
Formatting of this note might be different from the original.  Rounding Assessment:    Activity - Resting in bed.  CardioRespir - Spontaneous, equal chest rise. Respirs unlabored.  Skin - Warm and Dry.  Needs/Concerns - Updated on POC. Denies any further needs at this time.  Safety - Bed in lowest position. Call light within reach. Wheels locked. Side rails raised. Continuous monitoring in place.    Electronically signed by Cassell Smiles, RN at 05/01/2020  5:12 AM EDT

## (undated) NOTE — ED Notes (Signed)
Formatting of this note might be different from the original.  One pink bag and one black duffle bag (labeled) given to transporter.    Electronically signed by Abbey Chatters Ridenhour, RN at 01/15/2020  8:29 AM EDT
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
## (undated) NOTE — Progress Notes (Signed)
Formatting of this note is different from the original.  [facility]  Case Management      Patient:   Samuel Fry  MR Number:  [id number]  Patient Date of Birth: December 19, 1990  Age/Sex:  71 y.o./male        Informed that the Provider at [facility] will review case later on this morning to determine if patient is appropriate for admission.      Electronically signed:  Johnnye Lana, LCSW  01/11/2020 / 12:02 AM      Electronically signed by Rebecca Eaton, LCSW at 01/11/2020 12:04 AM EDT

## (undated) NOTE — Nursing Note (Signed)
Formatting of this note might be different from the original.  Patient denies cough, sore throat, or respiratory distress. Euphoric through evening hours of shift. Dietary delivered two bags of potato chips that patient had requested earlier in the day. After snack at 20:00 patient requested more snacks from this writer at approx 21:30. Stated that her meds gave her increased appetite. Given soda crackers and water. W/D to room. Given Trazodone 100mg  po at 21:07 with Seroquel and patient has not slept. Continues at this time to watch tv in room. Sleep aid meds not effective.   Electronically signed by Tye Savoy RN, OBHP at 12/21/2021  2:20 AM EDT

## (undated) NOTE — ED Notes (Signed)
Formatting of this note might be different from the original.  This RN assumes care. Pt resting quietly in bed asleep. No s/sx of acute distress noted. Will continue to monitor    Electronically signed by Dawayne Cirri, RN at 05/01/2020  8:08 AM EDT
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
## (undated) NOTE — Consults (Signed)
Formatting of this note is different from the original.  [facility] Psychiatry - ED Consult    Date of Service: 01/11/2020  Referral Source: Emergency Department  Record Review: moderate  Assessment     Psychiatric Diagnoses:  Principal Problem:    Bipolar affective disorder, current episode mixed (*)  Active Problems:    Asperger's syndrome    ADHD    Medical Diagnoses:      Formulation and MDM: Client endorses sucidal ideation,a plan to hang himself,and reports intentions to do so.  Client reports a recent discharge from a behavioral health facility about 2 days ago.  Client reports recurring thoughts of death. Client denies homicidal ideation, plans, or attempts    The patient has been evaluated and determined to be medically stable by the ED provider.  Patient has been assessed by the ED [facility] Therapist and the findings have been discussed with this provider.  Psychiatry was consulted to assist with psychiatric assessment and treatment/disposition planning.  The chart has been reviewed and pertinent findings are noted below. Based on this review and assessment, the treatment plan has been created and discussed with the treatment team.     Based on my assessment, patient requires psychiatric hospitalization due to risk of self injury.    Safety Assessment:  Individualized risk factors include: hopelessness and impulsiveness.  Individualized protective factors include: patient has treatable psychiatric disorders and symptoms.  Taking the aforementioned non-modifiable and modifiable risk factors in conjunction with his protective factors, the patient is currently felt to be of low imminent risk of harm to self.  To further mitigate risk, please see the below treatment recommendations.    Treatment Plan & Recommendation     - Disposition:   - Seek Inpatient Psychiatric Hospitalization  - Commitment Status: Involuntary  - Precautions:   - suicide  - Pertinent Labs:   - UDS negative   Chem profile reviewed:      ALT 79   AST 42  - Psych Med Recs:   - Prozac 20mg  po daily for depression, Seroquel 200mg  po q hs for mood/ruminations/insomnia.   - Medical Recs:   -     Chief Complaint     Suicidal with plan to hang self.     History of Present Illness     Samuel Fry is a 41 y.o. male with a history of  who presented to the ED for Client endorses sucidal ideation,a plan to hang himself,and reports intentions to do so.  Client reports a recent discharge from a behavioral health facility about 2 days ago.  Client reports recurring thoughts of death. Client denies homicidal ideation, plans, or attempts    Currently on interview in rm 69.  The patient endorses sucidal ideation,a plan to hang himself,and reports intentions to do so.  Client reports a recent discharge from a behavioral health facility about 2 days ago.  Client reports recurring thoughts of death. Client denies homicidal ideation, plans, or attempts.  He feels severely depressed, helpless and hopeless.  Pt does not give particular reasons for severe depression. He is very irritable in mood and cuts the interview short.  Ask me to leave so he can sleep now.    Current suicidal/homicidal ideations: Endorses feeling SI  Current auditory/visual hallucinations: Denies    Review Of Systems:  A complete review of systems of the following systems was conducted (Constitutional, Psychiatric, Neurological, Musculoskeletal, Eyes, Gastrointestinal, Cardiovascular, Respiratory, Skin, and Endocrine). All reviewed  systems are negative except pertinent positives identified in the HPI.    Past Psychiatric History     Previous diagnoses:Bipolar disorder, Autism (Asperger's), ADHD  Previous psychiatric medication trials:prozac, trazodone, dilantin, seroquel  Past suicidal/homicidal ideation/attempt: denies attempts  Current/Past psychiatric provider:none current, formerly Dr. Leonard Schwartz with [facility]  Previous psychiatric  hospitalizations/Rehab:[facility] in [place] - pt clarifies this was a medical hospitalization    Past Medical History     Past Medical History:   Diagnosis Date   ? ADHD    ? Asperger's disorder    ? Bipolar disorder (*)      Substance Use History     Marijuana: Denies  Cocaine: Denies  Opiates: Denies  Stimulants: Denies  Benzodiazepine: Denies  Tobacco:  Smoker 1 pack a day  Alcohol: Denies  Other illicit drug usage: Denies    Patient denies all other substance use except for what is listed above.    Readiness for substance/alcohol abuse treatment, if applicable: Not applicable    Family History     Family history of suicide? Unknown    Family History   Family history unknown: Yes     Social History     Access to firearms: pt denies    Social History     Socioeconomic History   ? Marital status: Single     Spouse name: Not on file   ? Number of children: Not on file   ? Years of education: Not on file   ? Highest education level: Not on file   Occupational History   ? Not on file   Tobacco Use   ? Smoking status: Current Some Day Smoker     Packs/day: 1.00     Start date: 01/29/2012   ? Smokeless tobacco: Never Used   Substance and Sexual Activity   ? Alcohol use: Not Currently     Comment: L/U 10 yearsa ago   ? Drug use: Not Currently     Comment: L/U 10 years ago   ? Sexual activity: Not on file   Other Topics Concern   ? Not on file   Social History Narrative   ? Not on file     Social Determinants of Health     Financial Resource Strain:    ? Difficulty of Paying Living Expenses:    Food Insecurity:    ? Worried About Programme researcher, broadcasting/film/video in the Last Year:    ? Barista in the Last Year:    Transportation Needs:    ? Freight forwarder (Medical):    ? Lack of Transportation (Non-Medical):    Physical Activity:    ? Days of Exercise per Week:    ? Minutes of Exercise per Session:    Stress:    ? Feeling of Stress :    Social Connections:    ? Frequency of Communication with Friends and Family:     ? Frequency of Social Gatherings with Friends and Family:    ? Attends Religious Services:    ? Active Member of Clubs or Organizations:    ? Attends Banker Meetings:    ? Marital Status:    Intimate Partner Violence:    ? Fear of Current or Ex-Partner:    ? Emotionally Abused:    ? Physically Abused:    ? Sexually Abused:      Evaluation     Vitals:   Vitals:    01/11/20 0818  BP: 123/63   Pulse: 55   Resp: 18   Temp: 98 F (36.7 C)   SpO2: 96%     Medications:    Allergies:  Allergies   Allergen Reactions   ? Bactrim [Sulfamethoxazole-Trimethoprim] Other     Make hair fall out   ? Geodon [Kdc:Yellow Dye+Ziprasidone+Brilliant Blue Fcf] Hives   ? Haldol [Haloperidol] Other     Makes pt feel like mouth is pried open   ? Latex Rash   ? Other Other     rash     Labs:  Recent Results (from the past 24 hour(s))   Salicylate Level    Collection Time: 01/10/20  6:40 PM   Result Value Ref Range    Salicylate <3.0 (L) 30.0 - 250.0 mcg/mL   Ethanol    Collection Time: 01/10/20  6:40 PM   Result Value Ref Range    Ethanol <10 0 mg/dL   Acetaminophen Level    Collection Time: 01/10/20  6:40 PM   Result Value Ref Range    Acetaminophen <5.0 (L) 10.0 - 25.0 mcg/mL   CBC And Differential    Collection Time: 01/10/20  6:40 PM   Result Value Ref Range    WBC 4.7 (L) 5.1 - 10.8 thou/mcL    RBC 6.08 (H) 4.05 - 5.64 million/mcL    HGB 17.1 13.5 - 17.5 gm/dL    HCT 16.1 09.6 - 04.5 %    MCV 81 (L) 83 - 97 fL    MCH 28.1 28.0 - 33.0 pg    MCHC 34.7 32.0 - 36.0 gm/dL    Plt Ct 409 811 - 914 thou/mcL    RDW SD 38.0 36.0 - 47.0 fL    MPV 10.8 8.9 - 11.0 fL    NRBC% 0.0 0 /100WBC    NRBC 0.000 0 thou/mcL    NEUTROPHIL % 63.8 50.0 - 70.0 %    LYMPHOCYTE % 24.9 (L) 25.0 - 40.0 %    MONOCYTE % 9.2 4.0 - 12.0 %    Eosinophil % 1.3 1.0 - 6.0 %    BASOPHIL % 0.6 0.0 - 2.0 %    IG% 0.200 0.001 - 0.429 %    ABSOLUTE NEUTROPHIL COUNT 2.99 1.50 - 7.50 thou/mcL    ABSOLUTE LYMPHOCYTE COUNT 1.2 1.0 - 4.5 thou/mcL    MONO ABSOLUTE 0.4  0.1 - 0.8 thou/mcL    EOS ABSOLUTE 0.1 0.0 - 0.5 thou/mcL    BASO ABSOLUTE 0.0 0.0 - 0.2 thou/mcL    IG ABSOLUTE 0.010 0.001 - 0.031 thou/mcL    Comprehensive Metabolic Panel    Collection Time: 01/10/20  6:40 PM   Result Value Ref Range    Na 138 136 - 146 mmol/L    Potassium 3.9 3.7 - 5.4 mmol/L    Cl 103 97 - 108 mmol/L    CO2 23 20 - 32 mmol/L    Glucose 114 (H) 65 - 99 mg/dL    BUN 10 6 - 20 mg/dL    Creatinine 7.82 9.56 - 1.27 mg/dL    Ca 9.5 8.7 - 21.3 mg/dL    ALK PHOS 88 25 - 086 U/L    T Bili 0.23 0.00 - 1.20 mg/dL    Total Protein 7.7 6.0 - 8.5 gm/dL    Alb 4.1 3.5 - 5.5 gm/dL    GLOBULIN 3.6 1.5 - 4.5 gm/dL    ALBUMIN/GLOBULIN RATIO 1.1 1.1 - 2.5    BUN/CREAT RATIO 13.2 11.0 - 26.0  ALT 79 (H) 0 - 55 U/L    AST 42 (H) 0 - 40 U/L    GFR AFRICAN AMERICAN 146 mL/min/1.82m2    GFR Non African American 126 mL/min/1.23m2    AGAP 12 7 - 16 mmol/L    GFR Non African American UNK       GFR Non African American UNK      GFR African American UNK      GFR African American UNK       Mental Status Evaluation     Constitutional:    General Appearance Wearing hospital scrubs and normal appearance    General Behavior Irritated, Defensive and Withdrawn   Musculoskeletal:    Gait and Station In bed for duration of assessment    Strength and tone Normal   Psychiatric:    Psychomotor Activity No motor abnormalities   Speech  Soft   Mood  Depressed   Affect  Flat and Depressed   Thought Process Circumstantial   Associations Intact association   Thought Content/Perceptual Disturbances Evidence of:  Ruminations  Suicidal ideation   Cognition/Sensorium  AAOx4; Memory, attention, language, and fund of knowledge intact    Insight  Poor   Judgment Poor     Electronically signed by:  Sharene Butters, FNP  01/11/2020 11:32 AM      Electronically signed by Henderson Cloud, MD at 01/12/2020 11:24 AM EDT    Associated attestation - Henderson Cloud, MD - 01/12/2020 11:24 AM EDT  Formatting of this note might be different  from the original.  I have discussed the patient; discussed treatment and care; reviewed this consult and at this time I am in agreement with the above. Further adjustments and modifications will be done as necessary.
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
## (undated) NOTE — Nursing Note (Signed)
Formatting of this note might be different from the original.  PT. Discharged with all her belongings to be admitted to [facility]  Electronically signed by Chauncey Mann, RN, OBHP at 12/18/2021 11:17 PM EDT

## (undated) NOTE — ED Provider Notes (Signed)
Formatting of this note is different from the original.     EMERGENCY MEDICINE  RAPID MEDICAL EXAMINATION NOTE       08/09/2021  9:31 PM EST    Subjective:   Chief Complaint:   Chief Complaint   Patient presents with    Medication Refill     Pt presents for refill on prozac, dilantin, seroquel, Pt denies SI/HI. Pt is calm and cooperative.      HPI:  Narrative:   Pt is a 55 yo male with hx of Autism and unspecified seizure disorder who presents to the ED for medication refill. Pt reportedly was discharged from [facility] earlier today after being there for 1-2 days for SI. He reports being discharged on Seroquel 200mg  at bedtime, Dilantin ER 300mg  at bedtime, Prozac 80mg  daily, and Trazodone 100mg  at bedtime (this is confirmed by looking at his [facility] chart and recent discharge summary). Reports that he lost his scripts and cannot find them, although unable to express how he misplaced the scripts. Pt denying acute psychiatric concerns otherwise; Denies SI/HI or AVH currently.     Pt states that he's currently homeless, but receives upwards of $900 through SSI. He notes that most of his income his spent on "traveling around the country." Reports that goal is to ultimately get to [place] where he has friends who he met online.     Review of Systems:  10 pt ROS negative except as documented above     Past Medical History: The information below is populated from the nursing note but has been reviewed by the provider.  Past Medical History:   Diagnosis Date    Incontinence     Seizures (CMS-HCC)      No past surgical history on file.   No family history on file.  Social History     Socioeconomic History    Marital status: Unknown     Spouse name: None    Number of children: None    Years of education: None    Highest education level: None   Occupational History    None   Tobacco Use    Smoking status: Every Day     Packs/day: 1.00     Types: Cigarettes    Smokeless tobacco: None   Substance and Sexual Activity    Alcohol  use: None    Drug use: None    Sexual activity: None   Other Topics Concern    None   Social History Narrative    None     Social Determinants of Health     Financial Resource Strain: Not on file   Food Insecurity: Not on file   Transportation Needs: Not on file   Housing Stability: Not on file     Objective: (at least two organ systems)  VS: Temp: 36.8 C (98.3 F)  Heart Rate: 89  Respirations: 20  Blood Pressure: 115/73  SpO2: 99 %    Mental Status Exam:   Level of Consciousness: Alert  Orientation: Grossly oriented to context of interview  Appearance: Caucasian male, somewhat unkempt, dressed in casual clothing  Behavior: Jovial, calm/cooperative  Speech: Fluent, Normal rate & rhythm, hyperverbal (attempting to engage staff in conversation throughout time in PES)  Mood: "Good"  Affect: Euthymic   Thought Process: Grossly linear  Thought Content:    Hallucinations: Denies   Delusions: None elicited    Suicidal Ideation: Denies    Homicidal Ideation: Denies  Insight/Judgment: Poor/poor  Gait/Station: wnl  Assessment/Diagnosis(es)   Plan/Recommendation    1. Medication Refill   2. Hx of Autism    Pt is a 45 yo male with hx of Autism and unspecified seizure disorder who presents to the ED for medication refill. Although pt is unable to recall how he lost his medication scripts, he expresses that he's currently homeless and is hopeful to have his medications filled. Given recent discharge from [facility] and medications able to be confirmed (pt correctly recalls medications as well), reasonable to discharge pt with 2 weeks worth of medications.     Otherwise, no acute psychiatric concerns. Pt denies SI/HI, AVH.   1. Discharge to self with 2 week paper scripts for medications (confirmed medications with [facility] chart, discharged earlier today from [facility]).       Disposition:Discharge: To self    Condition: stable    Documentation of Risk  In support of CPT [phone], the chart reflects that the patient was  seen for a new problem, has at least one system reviewed, has at least two organ systems examined, and had two or more data points ordered/reviewed.     Antony Salmon, MD  Supervision: Initial plan discussed with Dr Beulah Gandy    Emergency Department       Antony Salmon, MD  Resident  08/09/21 [phone]    Electronically signed by Farrel Conners., MD at 08/09/2021 10:21 PM EST    Associated attestation - Farrel Conners., MD - 08/09/2021 10:21 PM EST  Formatting of this note might be different from the original.  I saw and evaluated the patient. Discussed with the resident and agree with the resident's assessment and plan as documented in the resident's note.

## (undated) NOTE — ED Notes (Signed)
Formatting of this note might be different from the original.  Bed: 05 Tx Rm5  Expected date:   Expected time:   Means of arrival:   Comments:  dirty  Electronically signed by Johnnye Sima, RN at 02/08/2019  4:39 AM CDT

## (undated) NOTE — Progress Notes (Signed)
Formatting of this note is different from the original.  [place] [facility]        [facility] referrals was faxed to:    [facility]  [facility]  [facility]  [facility]    [facility] have no beds available today.  [facility] no beds available today.    Electronically signed:  Karrie Meres, LCSW  01/12/2020 / 11:20 AM      Electronically signed by Jeanette Caprice, LCSW at 01/12/2020  7:06 PM EDT

---

## 2019-08-14 ENCOUNTER — Emergency Department: Admit: 2019-08-15 | Payer: MEDICARE | Primary: Family Medicine

## 2019-08-14 DIAGNOSIS — R0789 Other chest pain: Secondary | ICD-10-CM

## 2019-08-14 NOTE — ED Notes (Signed)
I have reviewed discharge instructions with the patient.  The patient verbalized understanding.    Patient left ED via Discharge Method: ambulatory to homeless shelter with round trip.    Opportunity for questions and clarification provided.       Patient given 1 scripts.         To continue your aftercare when you leave the hospital, you may receive an automated call from our care team to check in on how you are doing.  This is a free service and part of our promise to provide the best care and service to meet your aftercare needs.??? If you have questions, or wish to unsubscribe from this service please call [phone].  Thank you for Choosing our [place] [facility].
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
## 2019-08-14 NOTE — ED Provider Notes (Signed)
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.  He arrived here from [place] today at 6 PM.  When he got off the bus he started having chest pain.  He describes it as midsternal nonradiating sharp pain that lasted 2 hours and then resolved.  He states the pain was mild.  He denies any associated shortness of breath or nausea or diaphoresis.  He denies similar pain in the past, states he feels fine now.  He did not take any medicine for his symptoms, denies any aggravating or alleviating factors.           No past medical history on file.    No past surgical history on file.      No family history on file.    Social History     Socioeconomic History   ??? Marital status: SINGLE     Spouse name: Not on file   ??? Number of children: Not on file   ??? Years of education: Not on file   ??? Highest education level: Not on file   Occupational History   ??? Not on file   Social Needs   ??? Financial resource strain: Not on file   ??? Food insecurity     Worry: Not on file     Inability: Not on file   ??? Transportation needs     Medical: Not on file     Non-medical: Not on file   Tobacco Use   ??? Smoking status: Not on file   Substance and Sexual Activity   ??? Alcohol use: Not on file   ??? Drug use: Not on file   ??? Sexual activity: Not on file   Lifestyle   ??? Physical activity     Days per week: Not on file     Minutes per session: Not on file   ??? Stress: Not on file   Relationships   ??? Social Wellsite geologist on phone: Not on file     Gets together: Not on file     Attends religious service: Not on file     Active member of club or organization: Not on file     Attends meetings of clubs or organizations: Not on file     Relationship status: Not on file   ??? Intimate partner violence     Fear of current or ex partner: Not on file     Emotionally abused: Not on file     Physically abused: Not on file      Forced sexual activity: Not on file   Other Topics Concern   ??? Not on file   Social History Narrative   ??? Not on file         ALLERGIES: Latex; Bactrim [sulfamethoprim]; and Geodon [ziprasidone hcl]    Review of Systems   Constitutional: Negative for chills and fever.   Gastrointestinal: Negative for nausea and vomiting.   All other systems reviewed and are negative.      Vitals:    08/14/19 1942   BP: (!) 112/56   Pulse: (!) 105   Resp: 20   Temp: 99 ??F (37.2 ??C)   SpO2: 95%   Weight: 119.7 kg (264 lb)   Height: 5\' 11"  (1.803 m)            Physical Exam  Vitals signs and nursing note reviewed.  Constitutional:       Appearance: He is well-developed. He is obese.      Comments: Disheveled pleasant white male who appears in no distress.   HENT:      Head: Normocephalic and atraumatic.   Eyes:      Conjunctiva/sclera: Conjunctivae normal.      Pupils: Pupils are equal, round, and reactive to light.   Neck:      Musculoskeletal: Normal range of motion and neck supple.   Cardiovascular:      Rate and Rhythm: Normal rate and regular rhythm.   Pulmonary:      Effort: Pulmonary effort is normal. No respiratory distress.      Breath sounds: Normal breath sounds.   Chest:      Chest wall: No tenderness.   Musculoskeletal: Normal range of motion.      Right lower leg: No edema.      Left lower leg: No edema.   Skin:     General: Skin is warm and dry.   Neurological:      Mental Status: He is alert and oriented to person, place, and time.          MDM  Number of Diagnoses or Management Options  Abnormal chest x-ray: new and does not require workup  Atypical chest pain: new and does not require workup  Diagnosis management comments: 9:34 PM discussed chest x-ray findings, need for antibiotics.       Amount and/or Complexity of Data Reviewed  Tests in the radiology section of CPT??: ordered and reviewed    Risk of Complications, Morbidity, and/or Mortality  Presenting problems: moderate  Diagnostic procedures: low   Management options: moderate    Patient Progress  Patient progress: stable         Procedures
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
## 2019-08-14 NOTE — ED Provider Notes (Signed)
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.  He arrived here from [place] today at 6 PM.  When he got off the bus he started having chest pain.  He describes it as midsternal nonradiating sharp pain that lasted 2 hours and then resolved.  He states the pain was mild.  He denies any associated shortness of breath or nausea or diaphoresis.  He denies similar pain in the past, states he feels fine now.  He did not take any medicine for his symptoms, denies any aggravating or alleviating factors.           No past medical history on file.    No past surgical history on file.      No family history on file.    Social History     Socioeconomic History   ??? Marital status: SINGLE     Spouse name: Not on file   ??? Number of children: Not on file   ??? Years of education: Not on file   ??? Highest education level: Not on file   Occupational History   ??? Not on file   Social Needs   ??? Financial resource strain: Not on file   ??? Food insecurity     Worry: Not on file     Inability: Not on file   ??? Transportation needs     Medical: Not on file     Non-medical: Not on file   Tobacco Use   ??? Smoking status: Not on file   Substance and Sexual Activity   ??? Alcohol use: Not on file   ??? Drug use: Not on file   ??? Sexual activity: Not on file   Lifestyle   ??? Physical activity     Days per week: Not on file     Minutes per session: Not on file   ??? Stress: Not on file   Relationships   ??? Social Wellsite geologist on phone: Not on file     Gets together: Not on file     Attends religious service: Not on file     Active member of club or organization: Not on file     Attends meetings of clubs or organizations: Not on file     Relationship status: Not on file   ??? Intimate partner violence     Fear of current or ex partner: Not on file     Emotionally abused: Not on file     Physically abused: Not on file     Forced sexual activity: Not on  file   Other Topics Concern   ??? Not on file   Social History Narrative   ??? Not on file         ALLERGIES: Latex; Bactrim [sulfamethoprim]; and Geodon [ziprasidone hcl]    Review of Systems   Constitutional: Negative for chills and fever.   Gastrointestinal: Negative for nausea and vomiting.   All other systems reviewed and are negative.      Vitals:    08/14/19 1942   BP: (!) 112/56   Pulse: (!) 105   Resp: 20   Temp: 99 ??F (37.2 ??C)   SpO2: 95%   Weight: 119.7 kg (264 lb)   Height: 5\' 11"  (1.803 m)            Physical Exam  Vitals signs and nursing note reviewed.  Constitutional:       Appearance: He is well-developed. He is obese.      Comments: Disheveled pleasant white male who appears in no distress.   HENT:      Head: Normocephalic and atraumatic.   Eyes:      Conjunctiva/sclera: Conjunctivae normal.      Pupils: Pupils are equal, round, and reactive to light.   Neck:      Musculoskeletal: Normal range of motion and neck supple.   Cardiovascular:      Rate and Rhythm: Normal rate and regular rhythm.   Pulmonary:      Effort: Pulmonary effort is normal. No respiratory distress.      Breath sounds: Normal breath sounds.   Chest:      Chest wall: No tenderness.   Musculoskeletal: Normal range of motion.      Right lower leg: No edema.      Left lower leg: No edema.   Skin:     General: Skin is warm and dry.   Neurological:      Mental Status: He is alert and oriented to person, place, and time.          MDM  Number of Diagnoses or Management Options  Abnormal chest x-ray: new and does not require workup  Atypical chest pain: new and does not require workup  Diagnosis management comments: 9:34 PM discussed chest x-ray findings, need for antibiotics.       Amount and/or Complexity of Data Reviewed  Tests in the radiology section of CPT??: ordered and reviewed    Risk of Complications, Morbidity, and/or Mortality  Presenting problems: moderate  Diagnostic procedures: low  Management options: moderate    Patient  Progress  Patient progress: stable         Procedures

## 2019-08-14 NOTE — ED Notes (Signed)
 I have reviewed discharge instructions with the patient.  The patient verbalized understanding.    Patient left ED via Discharge Method: ambulatory to homeless shelter with round trip.    Opportunity for questions and clarification provided.       Patient given 1 scripts.         To continue your aftercare when you leave the hospital, you may receive an automated call from our care team to check in on how you are doing.  This is a free service and part of our promise to provide the best care and service to meet your aftercare needs." If you have questions, or wish to unsubscribe from this service please call [phone].  Thank you for Choosing our [facility] Emergency Department.

## 2019-08-15 ENCOUNTER — Inpatient Hospital Stay
Admit: 2019-08-15 | Discharge: 2019-08-15 | Disposition: A | Discharge status: Home / Self Care | Payer: MEDICARE | Attending: Emergency Medicine

## 2019-08-15 DIAGNOSIS — Z590 Homelessness: Secondary | ICD-10-CM

## 2019-08-15 LAB — EKG, 12 LEAD, INITIAL
Atrial Rate: 96 {beats}/min
Calculated P Axis: 44 degrees
Calculated R Axis: 26 degrees
Calculated T Axis: 42 degrees
Diagnosis: NORMAL
P-R Interval: 164 ms
Q-T Interval: 330 ms
QRS Duration: 86 ms
QTC Calculation (Bezet): 416 ms
Ventricular Rate: 96 {beats}/min

## 2019-08-15 LAB — EKG 12-LEAD
Atrial Rate: 96 {beats}/min
Diagnosis: NORMAL
P Axis: 44 degrees
P-R Interval: 164 ms
Q-T Interval: 330 ms
QRS Duration: 86 ms
QTc Calculation (Bazett): 416 ms
R Axis: 26 degrees
T Axis: 42 degrees
Ventricular Rate: 96 {beats}/min

## 2019-08-15 MED ORDER — DOXYCYCLINE HYCLATE 100 MG TAB
100 mg | ORAL_TABLET | Freq: Two times a day (BID) | ORAL | 0 refills | Status: AC
Start: 2019-08-15 — End: 2019-08-24

## 2019-08-15 NOTE — ED Notes (Signed)
Pt A&O x4. Pt states he his from [place] and is out of money to travel back there. Pt was seen at [facility] earlier today for similar complaints and discharged after speaking with psychiatry. Pt reports SI, states "I might hang myself". Pt denies HI. Pt denies auditory or visual hallucinations. Pt provided with drinks and graham crackers, watching cartoons on his cell phone. Needs addressed.

## 2019-08-15 NOTE — ED Notes (Signed)
I have reviewed discharge instructions with the patient.  The patient verbalized understanding.    Patient left ED via Discharge Method: ambulatory to Home with self    Opportunity for questions and clarification provided.       Patient given 0 scripts.         To continue your aftercare when you leave the hospital, you may receive an automated call from our care team to check in on how you are doing.  This is a free service and part of our promise to provide the best care and service to meet your aftercare needs.??? If you have questions, or wish to unsubscribe from this service please call [phone].  Thank you for Choosing our [place] Emergency Department.

## 2019-08-15 NOTE — ED Provider Notes (Signed)
Patient presents with suicidal thoughts thinking of hanging himself.  Has had these in the past.  Has been seen at multiple ERs for this.  Has been attributed to lack of housing in the past.  Patient currently at the shelter here in town.  Originally from [place] per him.  Was seen earlier today at [facility] and evaluated by a psychiatrist who did not want to place patient on commitment papers and said he was stable for discharge.    The history is provided by the patient. No language interpreter was used.   Suicidal  This is a recurrent problem. The current episode started 6 to 12 hours ago. The problem has not changed since onset.There was no focality noted. Pertinent negatives include no focal weakness, no loss of sensation, no loss of balance, no slurred speech, no speech difficulty, no movement disorder, no visual change, no mental status change, no unresponsiveness and no disorientation. There has been no fever. Pertinent negatives include no shortness of breath, no chest pain, no vomiting, no altered mental status, no confusion, no headaches, no nausea, no bowel incontinence and no bladder incontinence.        No past medical history on file.    No past surgical history on file.      No family history on file.    Social History     Socioeconomic History   ??? Marital status: SINGLE     Spouse name: Not on file   ??? Number of children: Not on file   ??? Years of education: Not on file   ??? Highest education level: Not on file   Occupational History   ??? Not on file   Social Needs   ??? Financial resource strain: Not on file   ??? Food insecurity     Worry: Not on file     Inability: Not on file   ??? Transportation needs     Medical: Not on file     Non-medical: Not on file   Tobacco Use   ??? Smoking status: Not on file   Substance and Sexual Activity   ??? Alcohol use: Not on file   ??? Drug use: Not on file   ??? Sexual activity: Not on file   Lifestyle   ??? Physical activity     Days per week: Not on file      Minutes per session: Not on file   ??? Stress: Not on file   Relationships   ??? Social Product manager on phone: Not on file     Gets together: Not on file     Attends religious service: Not on file     Active member of club or organization: Not on file     Attends meetings of clubs or organizations: Not on file     Relationship status: Not on file   ??? Intimate partner violence     Fear of current or ex partner: Not on file     Emotionally abused: Not on file     Physically abused: Not on file     Forced sexual activity: Not on file   Other Topics Concern   ??? Not on file   Social History Narrative   ??? Not on file         ALLERGIES: Latex, Bactrim [sulfamethoprim], and Geodon [ziprasidone hcl]    Review of Systems   Constitutional: Negative for chills and fever.   HENT: Negative for rhinorrhea and sore  throat.    Eyes: Negative for pain and redness.   Respiratory: Negative for chest tightness, shortness of breath and wheezing.    Cardiovascular: Negative for chest pain and leg swelling.   Gastrointestinal: Negative for abdominal pain, bowel incontinence, diarrhea, nausea and vomiting.   Genitourinary: Negative for bladder incontinence, dysuria and hematuria.   Musculoskeletal: Negative for back pain, gait problem, neck pain and neck stiffness.   Skin: Negative for color change and rash.   Neurological: Negative for focal weakness, speech difficulty, weakness, numbness, headaches and loss of balance.   Psychiatric/Behavioral: Positive for suicidal ideas. Negative for confusion and self-injury.       Vitals:    08/15/19 2029   BP: (!) 128/58   Pulse: 93   Resp: 20   Temp: 99.4 ??F (37.4 ??C)   Weight: 119.7 kg (264 lb)   Height: '5\' 11"'  (1.803 m)            Physical Exam  Constitutional:       Appearance: Normal appearance. He is well-developed.   HENT:      Head: Normocephalic and atraumatic.   Neck:      Musculoskeletal: Normal range of motion and neck supple.   Cardiovascular:       Rate and Rhythm: Normal rate and regular rhythm.   Pulmonary:      Effort: Pulmonary effort is normal.      Breath sounds: Normal breath sounds.   Abdominal:      General: Bowel sounds are normal.      Palpations: Abdomen is soft.      Tenderness: There is no abdominal tenderness.   Musculoskeletal: Normal range of motion.   Skin:     General: Skin is warm and dry.   Neurological:      Mental Status: He is alert and oriented to person, place, and time.   Psychiatric:         Thought Content: Thought content includes suicidal ideation. Thought content does not include homicidal ideation. Thought content includes suicidal plan. Thought content does not include homicidal plan.          MDM  Number of Diagnoses or Management Options  Diagnosis management comments: Reviewed chart and spoke with patient.  He has already completed a psychiatric evaluation earlier today.  Agree with their assessment and note.  Patient seems focused on shelter and housing.  Will discharge with potential for cold weather shelter and follow-up with [facility] mental health.       Amount and/or Complexity of Data Reviewed  Clinical lab tests: reviewed and ordered    Patient Progress  Patient progress: stable         Procedures          Results Include:    Recent Results (from the past 24 hour(s))   CBC WITH AUTOMATED DIFF    Collection Time: 08/15/19 10:22 PM   Result Value Ref Range    WBC 7.2 4.3 - 11.1 K/uL    RBC 6.02 (H) 4.23 - 5.6 M/uL    HGB 17.1 13.6 - 17.2 g/dL    HCT 50.9 (H) 41.1 - 50.3 %    MCV 84.6 79.6 - 97.8 FL    MCH 28.4 26.1 - 32.9 PG    MCHC 33.6 31.4 - 35.0 g/dL    RDW 12.8 11.9 - 14.6 %    PLATELET 184 150 - 450 K/uL    MPV 10.4 9.4 - 12.3 FL    ABSOLUTE NRBC  0.00 0.0 - 0.2 K/uL    DF AUTOMATED      NEUTROPHILS 62 43 - 78 %    LYMPHOCYTES 26 13 - 44 %    MONOCYTES 10 4.0 - 12.0 %    EOSINOPHILS 2 0.5 - 7.8 %    BASOPHILS 1 0.0 - 2.0 %    IMMATURE GRANULOCYTES 0 0.0 - 5.0 %    ABS. NEUTROPHILS 4.5 1.7 - 8.2 K/UL     ABS. LYMPHOCYTES 1.8 0.5 - 4.6 K/UL    ABS. MONOCYTES 0.7 0.1 - 1.3 K/UL    ABS. EOSINOPHILS 0.1 0.0 - 0.8 K/UL    ABS. BASOPHILS 0.0 0.0 - 0.2 K/UL    ABS. IMM. GRANS. 0.0 0.0 - 0.5 K/UL   METABOLIC PANEL, COMPREHENSIVE    Collection Time: 08/15/19 10:22 PM   Result Value Ref Range    Sodium 140 136 - 145 mmol/L    Potassium 4.1 3.5 - 5.1 mmol/L    Chloride 106 98 - 107 mmol/L    CO2 29 21 - 32 mmol/L    Anion gap 5 (L) 7 - 16 mmol/L    Glucose 82 65 - 100 mg/dL    BUN 12 6 - 23 MG/DL    Creatinine 0.87 0.8 - 1.5 MG/DL    GFR est AA >60 >60 ml/min/1.53m    GFR est non-AA >60 >60 ml/min/1.753m   Calcium 9.2 8.3 - 10.4 MG/DL    Bilirubin, total 0.4 0.2 - 1.1 MG/DL    ALT (SGPT) 68 (H) 12 - 65 U/L    AST (SGOT) 23 15 - 37 U/L    Alk. phosphatase 106 50 - 136 U/L    Protein, total 7.5 6.3 - 8.2 g/dL    Albumin 4.0 3.5 - 5.0 g/dL    Globulin 3.5 2.3 - 3.5 g/dL    A-G Ratio 1.1 (L) 1.2 - 3.5     ETHYL ALCOHOL    Collection Time: 08/15/19 10:22 PM   Result Value Ref Range    ALCOHOL(ETHYL),SERUM <3 MG/DL

## 2019-08-15 NOTE — ED Triage Notes (Addendum)
Arrives with face mask in place. [facility] from [facility] with reports SI with plan to "hang myself". Onset of feelings 2 hours ago while eating. Denies attempting to hurt self in past. Pt from [place], [place]. States unable to get home due to "my money is all gone for this month". Reports receives disability check and spent on initial trip to [place]. States does not have money to get home. Seen here last night for chest pain. Denies having psychiatrist.

## 2019-08-15 NOTE — ED Notes (Signed)
 Pt A&O x4. Pt states he his from [place] and is out of money to travel back there. Pt was seen at [facility] earlier today for similar complaints and discharged after speaking with psychiatry. Pt reports SI, states I might hang myself. Pt denies HI. Pt denies auditory or visual hallucinations. Pt provided with drinks and graham crackers, watching cartoons on his cell phone. Needs addressed.

## 2019-08-15 NOTE — ED Provider Notes (Signed)
Patient presents with suicidal thoughts thinking of hanging himself.  Has had these in the past.  Has been seen at multiple ERs for this.  Has been attributed to lack of housing in the past.  Patient currently at the shelter here in town.  Originally from [place] per him.  Was seen earlier today at [facility] and evaluated by a psychiatrist who did not want to place patient on commitment papers and said he was stable for discharge.    The history is provided by the patient. No language interpreter was used.   Suicidal  This is a recurrent problem. The current episode started 6 to 12 hours ago. The problem has not changed since onset.There was no focality noted. Pertinent negatives include no focal weakness, no loss of sensation, no loss of balance, no slurred speech, no speech difficulty, no movement disorder, no visual change, no mental status change, no unresponsiveness and no disorientation. There has been no fever. Pertinent negatives include no shortness of breath, no chest pain, no vomiting, no altered mental status, no confusion, no headaches, no nausea, no bowel incontinence and no bladder incontinence.        No past medical history on file.    No past surgical history on file.      No family history on file.    Social History     Socioeconomic History   ??? Marital status: SINGLE     Spouse name: Not on file   ??? Number of children: Not on file   ??? Years of education: Not on file   ??? Highest education level: Not on file   Occupational History   ??? Not on file   Social Needs   ??? Financial resource strain: Not on file   ??? Food insecurity     Worry: Not on file     Inability: Not on file   ??? Transportation needs     Medical: Not on file     Non-medical: Not on file   Tobacco Use   ??? Smoking status: Not on file   Substance and Sexual Activity   ??? Alcohol use: Not on file   ??? Drug use: Not on file   ??? Sexual activity: Not on file   Lifestyle   ??? Physical activity     Days per week: Not on file     Minutes per session:  Not on file   ??? Stress: Not on file   Relationships   ??? Social Product manager on phone: Not on file     Gets together: Not on file     Attends religious service: Not on file     Active member of club or organization: Not on file     Attends meetings of clubs or organizations: Not on file     Relationship status: Not on file   ??? Intimate partner violence     Fear of current or ex partner: Not on file     Emotionally abused: Not on file     Physically abused: Not on file     Forced sexual activity: Not on file   Other Topics Concern   ??? Not on file   Social History Narrative   ??? Not on file         ALLERGIES: Latex, Bactrim [sulfamethoprim], and Geodon [ziprasidone hcl]    Review of Systems   Constitutional: Negative for chills and fever.   HENT: Negative for rhinorrhea and sore  throat.    Eyes: Negative for pain and redness.   Respiratory: Negative for chest tightness, shortness of breath and wheezing.    Cardiovascular: Negative for chest pain and leg swelling.   Gastrointestinal: Negative for abdominal pain, bowel incontinence, diarrhea, nausea and vomiting.   Genitourinary: Negative for bladder incontinence, dysuria and hematuria.   Musculoskeletal: Negative for back pain, gait problem, neck pain and neck stiffness.   Skin: Negative for color change and rash.   Neurological: Negative for focal weakness, speech difficulty, weakness, numbness, headaches and loss of balance.   Psychiatric/Behavioral: Positive for suicidal ideas. Negative for confusion and self-injury.       Vitals:    08/15/19 2029   BP: (!) 128/58   Pulse: 93   Resp: 20   Temp: 99.4 ??F (37.4 ??C)   Weight: 119.7 kg (264 lb)   Height: '5\' 11"'  (1.803 m)            Physical Exam  Constitutional:       Appearance: Normal appearance. He is well-developed.   HENT:      Head: Normocephalic and atraumatic.   Neck:      Musculoskeletal: Normal range of motion and neck supple.   Cardiovascular:      Rate and Rhythm: Normal rate and regular rhythm.    Pulmonary:      Effort: Pulmonary effort is normal.      Breath sounds: Normal breath sounds.   Abdominal:      General: Bowel sounds are normal.      Palpations: Abdomen is soft.      Tenderness: There is no abdominal tenderness.   Musculoskeletal: Normal range of motion.   Skin:     General: Skin is warm and dry.   Neurological:      Mental Status: He is alert and oriented to person, place, and time.   Psychiatric:         Thought Content: Thought content includes suicidal ideation. Thought content does not include homicidal ideation. Thought content includes suicidal plan. Thought content does not include homicidal plan.          MDM  Number of Diagnoses or Management Options  Diagnosis management comments: Reviewed chart and spoke with patient.  He has already completed a psychiatric evaluation earlier today.  Agree with their assessment and note.  Patient seems focused on shelter and housing.  Will discharge with potential for cold weather shelter and follow-up with [facility] mental health.       Amount and/or Complexity of Data Reviewed  Clinical lab tests: reviewed and ordered    Patient Progress  Patient progress: stable         Procedures          Results Include:    Recent Results (from the past 24 hour(s))   CBC WITH AUTOMATED DIFF    Collection Time: 08/15/19 10:22 PM   Result Value Ref Range    WBC 7.2 4.3 - 11.1 K/uL    RBC 6.02 (H) 4.23 - 5.6 M/uL    HGB 17.1 13.6 - 17.2 g/dL    HCT 50.9 (H) 41.1 - 50.3 %    MCV 84.6 79.6 - 97.8 FL    MCH 28.4 26.1 - 32.9 PG    MCHC 33.6 31.4 - 35.0 g/dL    RDW 12.8 11.9 - 14.6 %    PLATELET 184 150 - 450 K/uL    MPV 10.4 9.4 - 12.3 FL    ABSOLUTE NRBC  0.00 0.0 - 0.2 K/uL    DF AUTOMATED      NEUTROPHILS 62 43 - 78 %    LYMPHOCYTES 26 13 - 44 %    MONOCYTES 10 4.0 - 12.0 %    EOSINOPHILS 2 0.5 - 7.8 %    BASOPHILS 1 0.0 - 2.0 %    IMMATURE GRANULOCYTES 0 0.0 - 5.0 %    ABS. NEUTROPHILS 4.5 1.7 - 8.2 K/UL    ABS. LYMPHOCYTES 1.8 0.5 - 4.6 K/UL    ABS. MONOCYTES 0.7  0.1 - 1.3 K/UL    ABS. EOSINOPHILS 0.1 0.0 - 0.8 K/UL    ABS. BASOPHILS 0.0 0.0 - 0.2 K/UL    ABS. IMM. GRANS. 0.0 0.0 - 0.5 K/UL   METABOLIC PANEL, COMPREHENSIVE    Collection Time: 08/15/19 10:22 PM   Result Value Ref Range    Sodium 140 136 - 145 mmol/L    Potassium 4.1 3.5 - 5.1 mmol/L    Chloride 106 98 - 107 mmol/L    CO2 29 21 - 32 mmol/L    Anion gap 5 (L) 7 - 16 mmol/L    Glucose 82 65 - 100 mg/dL    BUN 12 6 - 23 MG/DL    Creatinine 0.87 0.8 - 1.5 MG/DL    GFR est AA >60 >60 ml/min/1.5m    GFR est non-AA >60 >60 ml/min/1.747m   Calcium 9.2 8.3 - 10.4 MG/DL    Bilirubin, total 0.4 0.2 - 1.1 MG/DL    ALT (SGPT) 68 (H) 12 - 65 U/L    AST (SGOT) 23 15 - 37 U/L    Alk. phosphatase 106 50 - 136 U/L    Protein, total 7.5 6.3 - 8.2 g/dL    Albumin 4.0 3.5 - 5.0 g/dL    Globulin 3.5 2.3 - 3.5 g/dL    A-G Ratio 1.1 (L) 1.2 - 3.5     ETHYL ALCOHOL    Collection Time: 08/15/19 10:22 PM   Result Value Ref Range    ALCOHOL(ETHYL),SERUM <3 MG/DL

## 2019-08-15 NOTE — ED Notes (Signed)
I have reviewed discharge instructions with the patient.  The patient verbalized understanding.    Patient left ED via Discharge Method: ambulatory to Home with self.    Opportunity for questions and clarification provided.       Patient given 0 scripts.         To continue your aftercare when you leave the hospital, you may receive an automated call from our care team to check in on how you are doing.  This is a free service and part of our promise to provide the best care and service to meet your aftercare needs." If you have questions, or wish to unsubscribe from this service please call [phone].  Thank you for Choosing our [place] Emergency Department.

## 2019-08-15 NOTE — ED Notes (Signed)
 Arrives with face mask in place. [facility] with reports SI with plan to hang myself. Onset of feelings 2 hours ago while eating. Denies attempting to hurt self in past. Pt from [place]. States unable to get home due to my money is all gone for this month. Reports receives disability check and spent on initial trip to [facility]. States does not have money to get home. Seen here last night for chest pain. Denies having psychiatrist.

## 2019-08-16 ENCOUNTER — Inpatient Hospital Stay
Admit: 2019-08-16 | Discharge: 2019-08-16 | Disposition: A | Discharge status: Home / Self Care | Payer: MEDICARE | Attending: Emergency Medicine

## 2019-08-16 ENCOUNTER — Inpatient Hospital Stay
Admit: 2019-08-16 | Discharge: 2019-08-16 | Disposition: A | Discharge status: Home / Self Care | Attending: Emergency Medicine

## 2019-08-16 DIAGNOSIS — F99 Mental disorder, not otherwise specified: Secondary | ICD-10-CM

## 2019-08-16 LAB — METABOLIC PANEL, COMPREHENSIVE
A-G Ratio: 1.1 — ABNORMAL LOW (ref 1.2–3.5)
ALT (SGPT): 68 U/L — ABNORMAL HIGH (ref 12–65)
AST (SGOT): 23 U/L (ref 15–37)
Albumin: 4 g/dL (ref 3.5–5.0)
Alk. phosphatase: 106 U/L (ref 50–136)
Anion gap: 5 mmol/L — ABNORMAL LOW (ref 7–16)
BUN: 12 MG/DL (ref 6–23)
Bilirubin, total: 0.4 MG/DL (ref 0.2–1.1)
CO2: 29 mmol/L (ref 21–32)
Calcium: 9.2 MG/DL (ref 8.3–10.4)
Chloride: 106 mmol/L (ref 98–107)
Creatinine: 0.87 MG/DL (ref 0.8–1.5)
GFR est AA: >60 mL/min/{1.73_m2} (ref >60)
GFR est non-AA: >60 mL/min/{1.73_m2} (ref >60)
Globulin: 3.5 g/dL (ref 2.3–3.5)
Glucose: 82 mg/dL (ref 65–100)
Potassium: 4.1 mmol/L (ref 3.5–5.1)
Protein, total: 7.5 g/dL (ref 6.3–8.2)
Sodium: 140 mmol/L (ref 136–145)

## 2019-08-16 LAB — DRUG SCREEN, URINE
AMPHETAMINES: NEGATIVE
Amphetamine Screen, Urine: NEGATIVE
BARBITURATES: NEGATIVE
BENZODIAZEPINES: NEGATIVE
Barbiturate Screen, Urine: NEGATIVE
Benzodiazepine Screen, Urine: NEGATIVE
COCAINE: NEGATIVE
Cocaine Screen Urine: NEGATIVE
METHADONE: NEGATIVE
Methadone Screen, Urine: NEGATIVE
OPIATES: NEGATIVE
Opiate Screen, Urine: NEGATIVE
PCP Screen, Urine: NEGATIVE
PCP(PHENCYCLIDINE): NEGATIVE
THC (TH-CANNABINOL): NEGATIVE
THC Screen, Urine: NEGATIVE

## 2019-08-16 LAB — CBC WITH AUTOMATED DIFF
ABS. BASOPHILS: 0 10*3/uL (ref 0.0–0.2)
ABS. EOSINOPHILS: 0.1 10*3/uL (ref 0.0–0.8)
ABS. IMM. GRANS.: 0 10*3/uL (ref 0.0–0.5)
ABS. LYMPHOCYTES: 1.8 10*3/uL (ref 0.5–4.6)
ABS. MONOCYTES: 0.7 10*3/uL (ref 0.1–1.3)
ABS. NEUTROPHILS: 4.5 10*3/uL (ref 1.7–8.2)
ABSOLUTE NRBC: 0 10*3/uL (ref 0.0–0.2)
BASOPHILS: 1 % (ref 0.0–2.0)
EOSINOPHILS: 2 % (ref 0.5–7.8)
HCT: 50.9 % — ABNORMAL HIGH (ref 41.1–50.3)
HGB: 17.1 g/dL (ref 13.6–17.2)
IMMATURE GRANULOCYTES: 0 % (ref 0.0–5.0)
LYMPHOCYTES: 26 % (ref 13–44)
MCH: 28.4 PG (ref 26.1–32.9)
MCHC: 33.6 g/dL (ref 31.4–35.0)
MCV: 84.6 FL (ref 79.6–97.8)
MONOCYTES: 10 % (ref 4.0–12.0)
MPV: 10.4 FL (ref 9.4–12.3)
NEUTROPHILS: 62 % (ref 43–78)
PLATELET: 184 10*3/uL (ref 150–450)
RBC: 6.02 M/uL — ABNORMAL HIGH (ref 4.23–5.6)
RDW: 12.8 % (ref 11.9–14.6)
WBC: 7.2 10*3/uL (ref 4.3–11.1)

## 2019-08-16 LAB — ETHYL ALCOHOL
ALCOHOL(ETHYL),SERUM: <3 MG/DL
Ethyl Alcohol: <3 MG/DL

## 2019-08-16 LAB — COMPREHENSIVE METABOLIC PANEL
ALT: 68 U/L — ABNORMAL HIGH (ref 12–65)
AST: 23 U/L (ref 15–37)
Albumin/Globulin Ratio: 1.1 — ABNORMAL LOW (ref 1.2–3.5)
Albumin: 4 g/dL (ref 3.5–5.0)
Alkaline Phosphatase: 106 U/L (ref 50–136)
Anion Gap: 5 mmol/L — ABNORMAL LOW (ref 7–16)
BUN: 12 MG/DL (ref 6–23)
CO2: 29 mmol/L (ref 21–32)
Calcium: 9.2 MG/DL (ref 8.3–10.4)
Chloride: 106 mmol/L (ref 98–107)
Creatinine: 0.87 MG/DL (ref 0.8–1.5)
EGFR IF NonAfrican American: >60 mL/min/{1.73_m2} (ref >60)
GFR African American: >60 mL/min/{1.73_m2} (ref >60)
Globulin: 3.5 g/dL (ref 2.3–3.5)
Glucose: 82 mg/dL (ref 65–100)
Potassium: 4.1 mmol/L (ref 3.5–5.1)
Sodium: 140 mmol/L (ref 136–145)
Total Bilirubin: 0.4 MG/DL (ref 0.2–1.1)
Total Protein: 7.5 g/dL (ref 6.3–8.2)

## 2019-08-16 LAB — CBC WITH AUTO DIFFERENTIAL
Basophils %: 1 % (ref 0.0–2.0)
Basophils Absolute: 0 10*3/uL (ref 0.0–0.2)
Eosinophils %: 2 % (ref 0.5–7.8)
Eosinophils Absolute: 0.1 10*3/uL (ref 0.0–0.8)
Granulocyte Absolute Count: 0 10*3/uL (ref 0.0–0.5)
Hematocrit: 50.9 % — ABNORMAL HIGH (ref 41.1–50.3)
Hemoglobin: 17.1 g/dL (ref 13.6–17.2)
Immature Granulocytes: 0 % (ref 0.0–5.0)
Lymphocytes %: 26 % (ref 13–44)
Lymphocytes Absolute: 1.8 10*3/uL (ref 0.5–4.6)
MCH: 28.4 PG (ref 26.1–32.9)
MCHC: 33.6 g/dL (ref 31.4–35.0)
MCV: 84.6 FL (ref 79.6–97.8)
MPV: 10.4 FL (ref 9.4–12.3)
Monocytes %: 10 % (ref 4.0–12.0)
Monocytes Absolute: 0.7 10*3/uL (ref 0.1–1.3)
NRBC Absolute: 0 10*3/uL (ref 0.0–0.2)
Neutrophils %: 62 % (ref 43–78)
Neutrophils Absolute: 4.5 10*3/uL (ref 1.7–8.2)
Platelets: 184 10*3/uL (ref 150–450)
RBC: 6.02 M/uL — ABNORMAL HIGH (ref 4.23–5.6)
RDW: 12.8 % (ref 11.9–14.6)
WBC: 7.2 10*3/uL (ref 4.3–11.1)

## 2019-08-16 NOTE — ED Notes (Signed)
Pt presents to the ED for c/o suicidal ideations.  Has been seen and evaluated at 2 other facilities for the same c/o. Was followed by case management and released from both other facilities. Pt will not pull up his mask even after he has reminded to do this.

## 2019-08-16 NOTE — ED Triage Notes (Signed)
Pt to the ED with c/o of suicide. Pt was seen at 2 other faculties today for the same c/o. Pt was evaluated and d/c at both places. Pt admits to being homeless. Pt states that he plans to hang himself, but does not have a rope but will buy one if he needs too. Pt also states that he does not have a job.     Pt was educated on the proper use of a face mask. Pt non-complaint with face mask. Pt was playing on his cell phone during triage.

## 2019-08-16 NOTE — ED Notes (Signed)
I have reviewed discharge instructions with the patient.  The patient verbalized understanding.    Patient left ED via Discharge Method: ambulatory to Home with  self,).    Opportunity for questions and clarification provided.       Patient given 0 scripts.         To continue your aftercare when you leave the hospital, you may receive an automated call from our care team to check in on how you are doing.  This is a free service and part of our promise to provide the best care and service to meet your aftercare needs.??? If you have questions, or wish to unsubscribe from this service please call [phone].  Thank you for Choosing our [place] Emergency Department.

## 2019-08-16 NOTE — ED Provider Notes (Signed)
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   Mental Health Problem   This is a chronic problem. The current episode started more than 1 week ago. The problem has not changed since onset.Pertinent negatives include no confusion, no somnolence, no weakness, no delusions, no self-injury and no numbness. His past medical history does not include diabetes, seizures, liver disease or CVA.        Past Medical History:   Diagnosis Date   ??? Psychiatric disorder     mental health       History reviewed. No pertinent surgical history.      History reviewed. No pertinent family history.    Social History     Socioeconomic History   ??? Marital status: SINGLE     Spouse name: Not on file   ??? Number of children: Not on file   ??? Years of education: Not on file   ??? Highest education level: Not on file   Occupational History   ??? Not on file   Social Needs   ??? Financial resource strain: Not on file   ??? Food insecurity     Worry: Not on file     Inability: Not on file   ??? Transportation needs     Medical: Not on file     Non-medical: Not on file   Tobacco Use   ??? Smoking status: Current Every Day Smoker   Substance and Sexual Activity   ??? Alcohol use: Never     Frequency: Never   ??? Drug use: Not Currently   ??? Sexual activity: Not on file   Lifestyle   ??? Physical activity     Days per week: Not on file     Minutes per session: Not on file   ??? Stress: Not on file   Relationships   ??? Social Wellsite geologistconnections     Talks on phone: Not on file     Gets together: Not on file     Attends religious service: Not on file     Active member of club or organization: Not on file     Attends meetings of clubs or organizations: Not on file      Relationship status: Not on file   ??? Intimate partner violence     Fear of current or ex partner: Not on file     Emotionally abused: Not on file     Physically abused: Not on file     Forced sexual activity: Not on file   Other Topics Concern   ??? Not on file   Social History Narrative   ??? Not on file         ALLERGIES: Latex, Bactrim [sulfamethoprim], and Geodon [ziprasidone hcl]    Review of Systems   Constitutional: Negative for activity change, chills, diaphoresis and fever.   HENT: Negative for dental problem, hearing loss, nosebleeds, rhinorrhea and sore throat.    Eyes: Negative for pain, discharge, redness and visual disturbance.   Respiratory: Negative for cough, chest tightness and shortness of breath.    Cardiovascular: Negative for chest pain, palpitations and leg swelling.   Gastrointestinal: Negative for abdominal pain,  constipation, diarrhea, nausea and vomiting.   Endocrine: Negative for cold intolerance, heat intolerance, polydipsia and polyuria.   Genitourinary: Negative for dysuria and flank pain.   Musculoskeletal: Negative for arthralgias, back pain, joint swelling, myalgias and neck pain.   Skin: Negative for pallor and rash.   Allergic/Immunologic: Negative for environmental allergies and food allergies.   Neurological: Negative for dizziness, tremors, weakness, light-headedness, numbness and headaches.   Hematological: Negative for adenopathy. Does not bruise/bleed easily.   Psychiatric/Behavioral: Positive for dysphoric mood and suicidal ideas. Negative for confusion and self-injury. The patient is not nervous/anxious and is not hyperactive.    All other systems reviewed and are negative.      Vitals:    08/16/19 0108 08/16/19 0416   BP: 120/79    Pulse: 84    Resp: 16    Temp: 98 ??F (36.7 ??C)    SpO2: 98% 98%   Weight: 119.7 kg (264 lb)    Height: 5\' 11"  (1.803 m)             Physical Exam  Vitals signs and nursing note reviewed.   Constitutional:       General: He is in acute distress.       Appearance: Normal appearance. He is well-developed. He is obese.      Comments: Patient is unkempt   HENT:      Head: Normocephalic and atraumatic.      Right Ear: External ear normal.      Left Ear: External ear normal.      Mouth/Throat:      Pharynx: No oropharyngeal exudate.   Eyes:      General: No scleral icterus.     Extraocular Movements: Extraocular movements intact.      Conjunctiva/sclera: Conjunctivae normal.      Pupils: Pupils are equal, round, and reactive to light.   Neck:      Musculoskeletal: Normal range of motion and neck supple.      Thyroid: No thyromegaly.      Vascular: No JVD.   Cardiovascular:      Rate and Rhythm: Normal rate and regular rhythm.      Heart sounds: Normal heart sounds. No murmur. No friction rub. No gallop.    Pulmonary:      Effort: Pulmonary effort is normal. No respiratory distress.      Breath sounds: Normal breath sounds. No wheezing.   Musculoskeletal: Normal range of motion.         General: No tenderness or deformity.   Skin:     General: Skin is warm and dry.      Capillary Refill: Capillary refill takes less than 2 seconds.      Findings: No rash.   Neurological:      General: No focal deficit present.      Mental Status: He is alert and oriented to person, place, and time.      Cranial Nerves: No cranial nerve deficit.      Sensory: No sensory deficit.      Motor: No abnormal muscle tone.      Coordination: Coordination normal.   Psychiatric:         Mood and Affect: Mood normal.         Speech: Speech normal.         Behavior: Behavior normal. Behavior is cooperative.         Thought Content: Thought content is not paranoid. Thought content does not include homicidal ideation.  Thought content does not include homicidal or suicidal plan.         Judgment: Judgment normal.          MDM  Number of Diagnoses or Management Options  Homelessness: established and worsening  Malingering: established and worsening   Passive suicidal ideations: established and worsening  Diagnosis management comments: 28 year old male with complaints of of wanting to hurt himself.  Patient has had similar complaints with multiple ER visits.  When asked for specifics the patient becomes very vague.  He stated to the triage nurse that he would go buy a rope and hang himself.  He did not relate a similar story to me about any specific plan for hurting himself.  When confronted he said "Oh, Yeah,  that is what I would do."    At this juncture I believe that the patient is stable and not a risk to himself or others  I reinforced the need for him to follow-up with mental health.  I have also reinforced the need for him to follow-up with the emergency shelter  Patient voices understanding of these instructions.       Amount and/or Complexity of Data Reviewed  Review and summarize past medical records: yes    Risk of Complications, Morbidity, and/or Mortality  Presenting problems: minimal  Diagnostic procedures: minimal  Management options: minimal  General comments: Elements of this note have been dictated via voice recognition software.  Text and phrases may be limited by the accuracy of the software.  The chart has been reviewed, but errors may still be present.      Patient Progress  Patient progress: stable         Procedures

## 2019-08-16 NOTE — ED Notes (Signed)
Pt left without discharge packet or dc vitals taken.

## 2019-08-16 NOTE — ED Notes (Signed)
I have reviewed discharge instructions with the patient.  The patient verbalized understanding.    Patient left ED via Discharge Method: ambulatory to Home with  self,).    Opportunity for questions and clarification provided.       Patient given 0 scripts.         To continue your aftercare when you leave the hospital, you may receive an automated call from our care team to check in on how you are doing.  This is a free service and part of our promise to provide the best care and service to meet your aftercare needs." If you have questions, or wish to unsubscribe from this service please call [phone].  Thank you for Choosing our [facility] Emergency Department.

## 2019-08-16 NOTE — ED Notes (Signed)
Pt left without discharge packet or dc vitals taken.

## 2019-08-16 NOTE — ED Notes (Signed)
Pt presents to the ED for c/o suicidal ideations.  Has been seen and evaluated at 2 other facilities for the same c/o. Was followed by case management and released from both other facilities. Pt will not pull up his mask even after he has reminded to do this.

## 2019-08-16 NOTE — ED Provider Notes (Signed)
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   Mental Health Problem   This is a chronic problem. The current episode started more than 1 week ago. The problem has not changed since onset.Pertinent negatives include no confusion, no somnolence, no weakness, no delusions, no self-injury and no numbness. His past medical history does not include diabetes, seizures, liver disease or CVA.        Past Medical History:   Diagnosis Date   ??? Psychiatric disorder     mental health       History reviewed. No pertinent surgical history.      History reviewed. No pertinent family history.    Social History     Socioeconomic History   ??? Marital status: SINGLE     Spouse name: Not on file   ??? Number of children: Not on file   ??? Years of education: Not on file   ??? Highest education level: Not on file   Occupational History   ??? Not on file   Social Needs   ??? Financial resource strain: Not on file   ??? Food insecurity     Worry: Not on file     Inability: Not on file   ??? Transportation needs     Medical: Not on file     Non-medical: Not on file   Tobacco Use   ??? Smoking status: Current Every Day Smoker   Substance and Sexual Activity   ??? Alcohol use: Never     Frequency: Never   ??? Drug use: Not Currently   ??? Sexual activity: Not on file   Lifestyle   ??? Physical activity     Days per week: Not on file     Minutes per session: Not on file   ??? Stress: Not on file   Relationships   ??? Social Product manager on phone: Not on file     Gets together: Not on file     Attends religious service: Not on file     Active member of club or organization: Not on file     Attends meetings of clubs or organizations: Not on file     Relationship  status: Not on file   ??? Intimate partner violence     Fear of current or ex partner: Not on file     Emotionally abused: Not on file     Physically abused: Not on file     Forced sexual activity: Not on file   Other Topics Concern   ??? Not on file   Social History Narrative   ??? Not on file         ALLERGIES: Latex, Bactrim [sulfamethoprim], and Geodon [ziprasidone hcl]    Review of Systems   Constitutional: Negative for activity change, chills, diaphoresis and fever.   HENT: Negative for dental problem, hearing loss, nosebleeds, rhinorrhea and sore throat.    Eyes: Negative for pain, discharge, redness and visual disturbance.   Respiratory: Negative for cough, chest tightness and shortness of breath.    Cardiovascular: Negative for chest pain, palpitations and leg swelling.   Gastrointestinal: Negative for abdominal pain,  constipation, diarrhea, nausea and vomiting.   Endocrine: Negative for cold intolerance, heat intolerance, polydipsia and polyuria.   Genitourinary: Negative for dysuria and flank pain.   Musculoskeletal: Negative for arthralgias, back pain, joint swelling, myalgias and neck pain.   Skin: Negative for pallor and rash.   Allergic/Immunologic: Negative for environmental allergies and food allergies.   Neurological: Negative for dizziness, tremors, weakness, light-headedness, numbness and headaches.   Hematological: Negative for adenopathy. Does not bruise/bleed easily.   Psychiatric/Behavioral: Positive for dysphoric mood and suicidal ideas. Negative for confusion and self-injury. The patient is not nervous/anxious and is not hyperactive.    All other systems reviewed and are negative.      Vitals:    08/16/19 0108 08/16/19 0416   BP: 120/79    Pulse: 84    Resp: 16    Temp: 98 ??F (36.7 ??C)    SpO2: 98% 98%   Weight: 119.7 kg (264 lb)    Height: 5\' 11"  (1.803 m)             Physical Exam  Vitals signs and nursing note reviewed.   Constitutional:       General: He is in acute distress.      Appearance:  Normal appearance. He is well-developed. He is obese.      Comments: Patient is unkempt   HENT:      Head: Normocephalic and atraumatic.      Right Ear: External ear normal.      Left Ear: External ear normal.      Mouth/Throat:      Pharynx: No oropharyngeal exudate.   Eyes:      General: No scleral icterus.     Extraocular Movements: Extraocular movements intact.      Conjunctiva/sclera: Conjunctivae normal.      Pupils: Pupils are equal, round, and reactive to light.   Neck:      Musculoskeletal: Normal range of motion and neck supple.      Thyroid: No thyromegaly.      Vascular: No JVD.   Cardiovascular:      Rate and Rhythm: Normal rate and regular rhythm.      Heart sounds: Normal heart sounds. No murmur. No friction rub. No gallop.    Pulmonary:      Effort: Pulmonary effort is normal. No respiratory distress.      Breath sounds: Normal breath sounds. No wheezing.   Musculoskeletal: Normal range of motion.         General: No tenderness or deformity.   Skin:     General: Skin is warm and dry.      Capillary Refill: Capillary refill takes less than 2 seconds.      Findings: No rash.   Neurological:      General: No focal deficit present.      Mental Status: He is alert and oriented to person, place, and time.      Cranial Nerves: No cranial nerve deficit.      Sensory: No sensory deficit.      Motor: No abnormal muscle tone.      Coordination: Coordination normal.   Psychiatric:         Mood and Affect: Mood normal.         Speech: Speech normal.         Behavior: Behavior normal. Behavior is cooperative.         Thought Content: Thought content is not paranoid. Thought content does not include homicidal ideation.  Thought content does not include homicidal or suicidal plan.         Judgment: Judgment normal.          MDM  Number of Diagnoses or Management Options  Homelessness: established and worsening  Malingering: established and worsening  Passive suicidal ideations: established and worsening  Diagnosis  management comments: [age] year old male with complaints of of wanting to hurt himself.  Patient has had similar complaints with multiple ER visits.  When asked for specifics the patient becomes very vague.  He stated to the triage nurse that he would go buy a rope and hang himself.  He did not relate a similar story to me about any specific plan for hurting himself.  When confronted he said "Oh, Yeah,  that is what I would do."    At this juncture I believe that the patient is stable and not a risk to himself or others  I reinforced the need for him to follow-up with mental health.  I have also reinforced the need for him to follow-up with the emergency shelter  Patient voices understanding of these instructions.       Amount and/or Complexity of Data Reviewed  Review and summarize past medical records: yes    Risk of Complications, Morbidity, and/or Mortality  Presenting problems: minimal  Diagnostic procedures: minimal  Management options: minimal  General comments: Elements of this note have been dictated via voice recognition software.  Text and phrases may be limited by the accuracy of the software.  The chart has been reviewed, but errors may still be present.      Patient Progress  Patient progress: stable         Procedures

## 2019-08-16 NOTE — ED Notes (Signed)
Pt to the ED with c/o of suicide. Pt was seen at 2 other faculties today for the same c/o. Pt was evaluated and d/c at both places. Pt admits to being homeless. Pt states that he plans to hang himself, but does not have a rope but will buy one if he needs too. Pt also states that he does not have a job.     Pt was educated on the proper use of a face mask. Pt non-complaint with face mask. Pt was playing on his cell phone during triage.

## 2020-05-31 ENCOUNTER — Inpatient Hospital Stay: Admit: 2020-05-31 | Discharge: 2020-06-01 | Payer: MEDICARE

## 2020-05-31 ENCOUNTER — Encounter: Admit: 2020-05-31 | Payer: PRIVATE HEALTH INSURANCE

## 2020-05-31 DIAGNOSIS — F909 Attention-deficit hyperactivity disorder, unspecified type: Secondary | ICD-10-CM

## 2020-05-31 DIAGNOSIS — T7411XA Adult physical abuse, confirmed, initial encounter: Secondary | ICD-10-CM

## 2020-05-31 DIAGNOSIS — T1491XA Suicide attempt, initial encounter: Secondary | ICD-10-CM

## 2020-05-31 DIAGNOSIS — F319 Bipolar disorder, unspecified: Secondary | ICD-10-CM

## 2020-05-31 LAB — COMPREHENSIVE METABOLIC PANEL
BKR A/G RATIO: 1.4 % (ref 1.0–2.2)
BKR ALANINE AMINOTRANSFERASE (ALT): 112 U/L — ABNORMAL HIGH (ref 9–59)
BKR ALBUMIN: 4 g/dL (ref 3.6–4.9)
BKR ALKALINE PHOSPHATASE: 93 U/L (ref 9–122)
BKR ANION GAP: 8 (ref 7–17)
BKR AST/ALT RATIO: 0.3 x 1000/??L (ref 0.0–1.0)
BKR BLOOD UREA NITROGEN: 9 mg/dL (ref 6–20)
BKR CALCIUM: 9.2 mg/dL (ref 8.8–10.2)
BKR CHLORIDE: 103 mmol/L — ABNORMAL LOW (ref 98–107)
BKR CO2: 26 mmol/L (ref 20–30)
BKR CREATININE: 0.8 mg/dL (ref 0.40–1.30)
BKR EGFR (AFR AMER): >60 mL/min/{1.73_m2} (ref >60)
BKR EGFR (NON AFRICAN AMERICAN): >60 mL/min/{1.73_m2} (ref >60)
BKR GLOBULIN: 2.8 g/dL (ref 2.3–3.5)
BKR GLUCOSE: 110 mg/dL — ABNORMAL HIGH (ref 70–100)
BKR POTASSIUM: 3.9 mmol/L (ref 3.3–5.1)
BKR PROTEIN TOTAL: 6.8 g/dL (ref 6.6–8.7)
BKR SODIUM: 137 mmol/L (ref 136–144)

## 2020-05-31 LAB — CBC WITH AUTO DIFFERENTIAL
BKR ASPARTATE AMINOTRANSFERASE (AST): 12.4 % — ABNORMAL HIGH (ref 4.0–15.0)
BKR BILIRUBIN TOTAL: 57.6 % (ref 37.0–84.0)
BKR BUN / CREAT RATIO: 187 x1000/??L (ref 140–440)
BKR WAM ABSOLUTE IMMATURE GRANULOCYTES: 0 x 1000/??L (ref 0.0–0.3)
BKR WAM ABSOLUTE LYMPHOCYTE COUNT: 1 x 1000/??L (ref 1.0–4.0)
BKR WAM ABSOLUTE NRBC: 0 x 1000/??L (ref 0.0–0.0)
BKR WAM ANALYZER ANC: 2.1 x 1000/ÂµL (ref 1.0–11.0)
BKR WAM BASOPHIL ABSOLUTE COUNT: 0 x 1000/??L (ref 0.0–0.0)
BKR WAM BASOPHILS: 0.8 % (ref 0.0–4.0)
BKR WAM EOSINOPHIL ABSOLUTE COUNT: 0.1 x 1000/ÂµL (ref 0.0–1.0)
BKR WAM EOSINOPHILS: 3 % (ref 0.0–7.0)
BKR WAM HEMATOCRIT: 47 % (ref 37.0–52.0)
BKR WAM HEMOGLOBIN: 15.8 g/dL (ref 12.0–18.0)
BKR WAM IMMATURE GRANULOCYTES: 0.3 % (ref 0.0–3.0)
BKR WAM LYMPHOCYTES: 25.9 % (ref 8.0–49.0)
BKR WAM MCH (PG): 27.9 pg (ref 27.0–31.0)
BKR WAM MCHC: 33.6 g/dL (ref 31.0–36.0)
BKR WAM MCV: 82.9 fL (ref 78.0–94.0)
BKR WAM MONOCYTE ABSOLUTE COUNT: 0.5 x 1000/ÂµL (ref 0.0–2.0)
BKR WAM MONOCYTES: 12.4 % (ref 4.0–15.0)
BKR WAM MPV: 10.6 fL (ref 6.0–11.0)
BKR WAM NEUTROPHILS: 57.6 % (ref 37.0–84.0)
BKR WAM NUCLEATED RED BLOOD CELLS: 0 % (ref 0.0–1.0)
BKR WAM PLATELETS: 187 x1000/ÂµL (ref 140–440)
BKR WAM RDW-CV: 13.2 % (ref 11.5–14.5)
BKR WAM RED BLOOD CELL COUNT: 5.7 M/ÂµL (ref 3.8–5.9)
BKR WAM WHITE BLOOD CELL COUNT: 3.7 x1000/ÂµL — ABNORMAL LOW (ref 4.0–10.0)

## 2020-05-31 LAB — SARS COV-2 (COVID-19) RNA-~~LOC~~ LABS (BH GH LMW YH): BKR SARS-COV-2 RNA (COVID-19) (YH): NEGATIVE

## 2020-05-31 MED ORDER — IBUPROFEN 600 MG TABLET
600 mg | Freq: Four times a day (QID) | ORAL | Status: DC | PRN
Start: 2020-05-31 — End: 2020-06-01

## 2020-05-31 MED ORDER — PHENYTOIN 50 MG CHEWABLE TABLET
50 mg | Freq: Three times a day (TID) | ORAL | Status: AC
Start: 2020-05-31 — End: ?

## 2020-05-31 MED ORDER — HYDROXYZINE HCL 25 MG TABLET
25 mg | ORAL | Status: DC | PRN
Start: 2020-05-31 — End: 2020-06-01
  Administered 2020-06-01: 01:00:00 25 mg via ORAL

## 2020-05-31 MED ORDER — FLUOXETINE 20 MG TABLET
20 mg | Freq: Every day | ORAL | Status: AC
Start: 2020-05-31 — End: ?

## 2020-05-31 MED ORDER — FLUOXETINE 20 MG CAPSULE
20 mg | Freq: Every day | ORAL | Status: DC
Start: 2020-05-31 — End: 2020-06-01
  Administered 2020-06-01: 15:00:00 20 mg via ORAL

## 2020-05-31 MED ORDER — QUETIAPINE IMMEDIATE RELEASE 200 MG TABLET
200 mg | Freq: Every evening | ORAL | Status: AC
Start: 2020-05-31 — End: ?

## 2020-05-31 MED ORDER — TRAZODONE 100 MG TABLET
100 mg | Freq: Every evening | ORAL | Status: AC
Start: 2020-05-31 — End: ?
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
## 2020-05-31 NOTE — ED Notes
7:15 AM Report received from [facility], care assumed. Pt here for SI. Pt is in a highly visible area, no signs of acute distress noted. Sitter at bedside. Pending psych consult. Pts name is Ralph Rubio but is in chart as Ralph Rubio. Registration is reportedly aware. No past medical history on file.9:31 AM Pt laying on stretcher with eyes closed, breathing is even and effortless. No signs of acute distress. Pending psych evaluation. 10:57 AM Social work is at bedside speaking to pt. 11:15 AM Pt requesting a new diaper and wipes so he can get clean. Pt provided with supplies and walked to the bathroom with sitter. 12:19 PM Psych MD resident at bedside speaking to pt. 4:16 PM Report given to [employer] Plan is for pt to be transported over via [facility]. 4:24 PM [facility] called for pt transport to [place]. 5:03 PM Pt sitting on stretcher watching tv, no signs of acute distress noted. Pt continually asking for an update on his sister and wanting to speak to her. 5:25 PM Pt provided with diaper and walked to bathroom to provide his own incontinence care.

## 2020-05-31 NOTE — ED Notes
7:27 PM Report received. Patient resting on stretcher awaiting transport to [facility]
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
## 2020-05-31 NOTE — ED Notes
4:44 AM Report received from [place], [place]. Care assumed. 4:50 AM- Pt requesting to Scientist, forensic.5:42 AM- Pt continuing to watch TV. 6:58 AM- Pt sleeping, rise and fall of chest wall visible. VSS. Sitter at bedside

## 2020-06-01 ENCOUNTER — Encounter: Admit: 2020-06-01 | Payer: PRIVATE HEALTH INSURANCE | Attending: Emergency Medicine

## 2020-06-01 DIAGNOSIS — F319 Bipolar disorder, unspecified: Secondary | ICD-10-CM

## 2020-06-01 DIAGNOSIS — T1491XA Suicide attempt, initial encounter: Secondary | ICD-10-CM

## 2020-06-01 DIAGNOSIS — F909 Attention-deficit hyperactivity disorder, unspecified type: Secondary | ICD-10-CM

## 2020-06-01 DIAGNOSIS — T7411XA Adult physical abuse, confirmed, initial encounter: Secondary | ICD-10-CM

## 2020-06-01 LAB — ZZZURINE DRUG SCREENING, NO CONF.??(YH)
BKR AMPHETAMINE SCREEN, URINE, NO CONF.: NEGATIVE
BKR BARBITURATE SCREEN, URINE, NO CONF.: NEGATIVE
BKR BENZODIAZEPINES SCREEN, URINE, NO CONF.: NEGATIVE
BKR CANNABINOIDS SCREEN, URINE, NO CONF.: NEGATIVE
BKR COCAINE SCREEN, URINE, NO CONF.: NEGATIVE
BKR FENTANYL: NEGATIVE
BKR OPIATES SCREEN, URINE, NO CONF.: NEGATIVE
BKR OXYCODONE SCREEN, URINE, NO CONF.: NEGATIVE
BKR PHENCYCLIDINE (PCP) SCREEN, URINE, NO CONF.: NEGATIVE

## 2020-06-01 LAB — ZZZURINE DRUG SCREENING, NO CONF.Â (YH): BKR METHADONE METABOLITE SCREEN, URINE, NO CONF.: NEGATIVE

## 2020-06-01 MED ORDER — LORAZEPAM 1 MG TABLET
1 mg | Freq: Once | ORAL | Status: CP
Start: 2020-06-01 — End: ?
  Administered 2020-06-01: 15:00:00 1 mg via ORAL

## 2020-06-01 NOTE — Discharge Instructions
Please call 2-1-1 if you are having thoughts of suicide or feeling in danger or unsafe. Please call 9-1-1 if in immediate danger of hurting yourself or another personPlease follow up with the handout you were provided for a list of possible resources that are available for you

## 2020-06-01 NOTE — ED Notes
7:30 AM Received report from offgoing RN. Patient appears asleep, unlabored breathing. 9:02 AM Patient continues to appear asleep, unlabored breathing. 10:30 AMPatient is awake and sitting in the common area. Immediately upon awakening, patient is perseverative regarding where his sister is and needing to leave the hospital to find his sister. Support provided and insight promoted. Patient is eating breakfast. Urine obtained as ordered. 11:21 AMPatient met with the MD after which he received his scheduled prozac 20mg  and ativan 1mg  per instruction of MD. Patient is resting in bed, unlabored breathing. 12:00 PM Patient continues to request release from the hospital. Patient states he does not need to be in the hospital and states he needs to meet with his sister. Patient denies SI/HI/AVH. 12:28 PMPatient ate lunch. 12:30 PMPatient met with the MD and expressed relief that he will be releasing from the hospital. Patient continues to express future orientation, stating he and his sister plan to travel to [place] next. Patient declined list of local resources, however, was given one just in case he shall need it. Patient denies SI/HI/AVH. 12:38 PM Patient released from the hospital accompanied by staff to the exit. All belongings returned.
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
## 2020-06-01 NOTE — Plan of Care
Plan of Care Overview/ Patient Status    Emergency Room Psychiatric Plan of Care Plan of Care Note Type:  Initial Plan of CareSafety Intervention(s):  Assumed care of patient, Patient identification verified, Patient notified of video monitoring, Patient oriented to unit including bathroom, shower, phone, visiting policy and patient rights and Routine safety checks per unit policySupervision Intervention(s):  Standard/Routine per unit policyLegal Status Intervention(s):  Not yet determinedMedication Intervention(s):  Medications administered as orderedVital Signs Intervention(s):  Vital signs obtained as orderedDiet Intervention(s):  Meals, snacks and beverages offerredPain Intervention(s):  Pain assessedADL's Intervention(s):  Bathroom facilities offeredSleep Intervention(s):  Sleep and relaxation offered and promotedSupport Intervention(s):  Staff support and Diversional activities offeredEducation Intervention(s):  Medication information provided to patient and/or familyTreatment Plan Intervention(s):  Treatment Plan reviewed with patient and/or familyGoal - Patient verbalizes an understanding for requiring a psychiatric evaluation in the Emergency Department:  ActiveGoal - Patient demonstrates controlled behavior:  ActiveGoal - Patient utilizes self-discipline and coping strategies to reduce anxiety:  ActiveGoal - Patient verbalizes to staff if he/she feels unsafe and/or plans to hurt self/others:  ActiveShift DocumentationSee ED note.Alm Bustard, RN10/10/2110:29 AM

## 2020-06-01 NOTE — Plan of Care
Plan of Care Overview/ Patient Status    Emergency Room Psychiatric Plan of Care Plan of Care Note Type:  Plan of Care UpdateSafety Intervention(s):  Assumed care of patient, Patient identification verified and Routine safety checks per unit policySupervision Intervention(s):  Standard/Routine per unit policyLegal Status Intervention(s):  Not yet determinedMedication Intervention(s):  Medications administered as orderedVital Signs Intervention(s):  Vital signs obtained as orderedDiet Intervention(s):  Meals, snacks and beverages offerredPain Intervention(s):  Pain assessedADL's Intervention(s):  Bathroom facilities offered, Shower facilities offered and Assistance offered as neededSleep Intervention(s):  Sleep and relaxation offered and promotedSupport Intervention(s):  Staff support and Diversional activities offeredEducation Intervention(s):  Medication information provided to patient and/or familyTreatment Plan Intervention(s):  Treatment Plan reviewed with patient and/or family and Discharge Plan reviewed with patient and/or familyDischarge Plan Intervention(s):  Medication information provided and Discharge Plan/instructions providedGoal - Patient verbalizes an understanding for requiring a psychiatric evaluation in the Emergency Department:  ResolvedGoal - Patient demonstrates controlled behavior:  ResolvedGoal - Patient utilizes self-discipline and coping strategies to reduce anxiety:  ResolvedGoal - Patient verbalizes to staff if he/she feels unsafe and/or plans to hurt self/others:  ResolvedShift DocumentationSee ED note.Alm Bustard, RN10/10/2110:29 AMSee ED Note. Clista Bernhardt, RN-BC10/10/2019 12:24 PM

## 2020-06-12 ENCOUNTER — Encounter: Admit: 2020-06-12 | Payer: PRIVATE HEALTH INSURANCE | Attending: Emergency Medicine

## 2020-06-12 ENCOUNTER — Inpatient Hospital Stay: Admit: 2020-06-12 | Discharge: 2020-06-12 | Payer: MEDICARE

## 2020-06-12 DIAGNOSIS — F319 Bipolar disorder, unspecified: Secondary | ICD-10-CM

## 2020-06-12 DIAGNOSIS — T7411XA Adult physical abuse, confirmed, initial encounter: Secondary | ICD-10-CM

## 2020-06-12 DIAGNOSIS — T1491XA Suicide attempt, initial encounter: Secondary | ICD-10-CM

## 2020-06-12 DIAGNOSIS — F909 Attention-deficit hyperactivity disorder, unspecified type: Secondary | ICD-10-CM

## 2020-06-12 MED ORDER — COVID-19 VACCINE, AD26.COV2.S (JANSSEN)(PF) 0.5 ML IM SUSPENSION (EUA)
0.5 mL | Freq: Once | INTRAMUSCULAR | Status: DC
Start: 2020-06-12 — End: 2020-06-12
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
## 2020-06-12 NOTE — ED Notes
7:09 AM Report taken from off going RN, care assumed. Awaiting psych. 8:02 AM Pt remains resting in stretcher. Sitter at bedside. Awaiting psych. 10:38 AM Pt asleep in stretcher. Awaiting psych. 12:27 PM Report given to flex care RN, care deferred.

## 2020-06-12 NOTE — ED Notes
3:42 PM Assumed pt care. Pt to be d/c. Cab called.
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
## 2020-06-12 NOTE — ED Notes
12:25 PM Report received from RN, care assumed. Pt resting in stretcher, side rails up, NAD. Pending psych eval. Sitter at bedside. 3:15 PM Report given to RN, care deferred.

## 2020-06-12 NOTE — Discharge Instructions
For outpatient treatment, go to [facility] for outpatient walk-in treatment Monday-Friday from [address], [place], [place] [phone]
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
## 2021-02-16 ENCOUNTER — Other Ambulatory Visit: Payer: Self-pay

## 2021-02-16 ENCOUNTER — Emergency Department
Admission: EM | Admit: 2021-02-16 | Discharge: 2021-02-16 | Disposition: A | Discharge status: Home / Self Care | Payer: Medicare Other | Attending: Emergency Medicine | Admitting: Emergency Medicine

## 2021-02-16 DIAGNOSIS — Z720 Tobacco use: Secondary | ICD-10-CM | POA: Insufficient documentation

## 2021-02-16 DIAGNOSIS — Z9151 Personal history of suicidal behavior: Secondary | ICD-10-CM | POA: Insufficient documentation

## 2021-02-16 DIAGNOSIS — Z5900 Homelessness unspecified: Secondary | ICD-10-CM | POA: Insufficient documentation

## 2021-02-16 DIAGNOSIS — Z008 Encounter for other general examination: Secondary | ICD-10-CM

## 2021-02-16 DIAGNOSIS — R45851 Suicidal ideations: Secondary | ICD-10-CM | POA: Insufficient documentation

## 2021-02-16 LAB — CBC (HEMOGRAM)
Hematocrit: 46 % (ref 38.0–50.0)
Hemoglobin: 16.3 g/dL (ref 13.0–18.0)
MCH: 28.4 pg (ref 27.3–33.6)
MCHC: 35.4 g/dL (ref 32.2–36.5)
MCV: 81 fL (ref 81–98)
Platelet Count: 198 10*3/uL (ref 150–400)
RBC: 5.73 10*6/uL — ABNORMAL HIGH (ref 4.40–5.60)
RDW-CV: 12.5 % (ref 11.6–14.4)
WBC: 6.4 10*3/uL (ref 4.3–10.0)

## 2021-02-16 LAB — COMPREHENSIVE METABOLIC PANEL
ALT (GPT): 31 U/L (ref 10–64)
AST (GOT): 21 U/L (ref 9–38)
Albumin: 4.1 g/dL (ref 3.5–5.2)
Alkaline Phosphatase (Total): 71 U/L (ref 35–109)
Anion Gap: 7 (ref 4–12)
Bilirubin (Total): 0.3 mg/dL (ref 0.2–1.3)
Calcium: 9.6 mg/dL (ref 8.9–10.2)
Carbon Dioxide, Total: 27 meq/L (ref 22–32)
Chloride: 102 meq/L (ref 98–108)
Creatinine: 0.73 mg/dL (ref 0.51–1.18)
Glucose: 80 mg/dL (ref 62–125)
Potassium: 3.8 meq/L (ref 3.6–5.2)
Protein (Total): 7.1 g/dL (ref 6.0–8.2)
Sodium: 136 meq/L (ref 135–145)
Urea Nitrogen: 15 mg/dL (ref 8–21)
eGFR by CKD-EPI: >60 mL/min/{1.73_m2} (ref >59)

## 2021-02-16 LAB — EMERGENCY DRUG SCREEN, URINE
Amphetamine Qual, Urine: NEGATIVE
Barbiturate Qual, Urine: POSITIVE — AB
Benzodiazepines Qual, Urine: NEGATIVE
Cannabinoids Qual, Urine: NEGATIVE
Cocaine Qual, Urine: NEGATIVE
Methadone Qual, Urine: NEGATIVE
Methamphetamine Qual, Urine: NEGATIVE
Opiates Qual, Urine: NEGATIVE
Oxycodone Qual, Urine: NEGATIVE
Phencyclidine Qual, Urine: NEGATIVE
Tricyclic Antidepressants, Urine: POSITIVE — AB

## 2021-02-16 LAB — ACETAMINOPHEN (TYLENOL): Acetaminophen (Tylenol): <10 ug/mL (ref 0–25)

## 2021-02-16 LAB — ALCOHOL (ETHYL): Alcohol (Ethyl): NEGATIVE mg/dL

## 2021-02-16 NOTE — ED Notes (Signed)
ED Social Work Mental Health Assessment  Language: English  Interpreter: No       ID / CC / Reason for Referral:   Chief Complaint   Patient presents with   . Suicidal With A Plan     Identifying data/reason for referral:  Referral Source: Physician  Referral Reason: Other (Comment) (MHE)    HPI: 30 year old-male with a self-reported history of bipolar, PTSD, ADHD and aspergers self-presents to [facility] ED due to [facility] with a plan to hang self for a couple days. Per chart, patient had three ED visits mid-June leading to transfer to [facility] on 02/10/21 for voluntary psychiatric treatment. Patient states "I left AMA today" and "they weren't nice to me". Pt reports experiencing homophobic comments and requesting to leave. Pt reports speaking to his mom on the phone today which increased his thoughts of suicide. Denies current intent to attempt suicide. Pt reports chronic SI at baseline increased by psychosocial stressors. Pt describes current mental health symptoms of low energy, decreased sleep and intermittent SI. Pt denies HI/AVH/CAH. He describes recent stressors of being new to the [place] area, limited financial means, and being separated from his partner who is in [place]. Pt initially requesting voluntary hospitalization to [facility], stating "I heard good things". TW called [facility] who does not have a bed available for pt. TW relays information to pt who endorses feeling safe in the ED and denies current SI.Pt is future oriented and goal directed spending most of interview discussing his plans to reunite with his partner and linear steps to complete tasks to do so. Pt endorses stress regarding short-term plan in [place] d/t lack of financial means. He plans to return to [place] 7/1 when obtaining financial means through SSDI. Pt is making needs known requesting private room, sleep, food, transportation. Pt reports [facility] [place] [facility] with [place]pt follow-up mental health appt.    Social  History:  Living situation prior to admit: Homeless  Support system: Spouse/significant other  Pt. was born and raised in [place] in the foster care system. Does have some involvement with his biological mom who he reports is incarcerated. Reports boyfriend in [place] is a strong social support. Reports being nomadic frequently moving states.    RELIGIOUS/SPIRITUAL BELIEF: None reported.  SIGNIFICANT EVENTS/TRAUMA HX/DOMESTIC VIOLENCE:  1. Endorses significant childhood trauma r/t growing up in foster system.  MARITAL STATUS/RELATIONSHIPS: partner, Antonio Webb, lives in [place]. Planning to move there in July. Working towards obtaining passport to move.  EDUCATION:  12th grade (diploma)   EMPLOYMENT HX: unemployed, on SSDI  MILITARY HX: None reported.  LEGAL HX:  denies both past and current legal history    Family History: not asked at today's visit.    Psychiatric History:  Psychiatric History Indicated?: Yes  Other Psychiatric History / Information: Per [facility], no history on file.    Mania/Hypomania:  Pt denies increased energy, grandiosity, hypersexuality, and hyperreligiosity. Pt does endorse increased spending r/t obtaining SSDI early in the month, which creates financial strain for the remainder of the month.    Psychotic Symptoms (Paranoia, A/V Hallucinations, Delusions): Denies. Not observed to be RISing.       HOSPITALIZATION(S):  Pt reports multiple hospitalizations in [place] and [place]. He was recently admitted to [facility] on 02/10/21 and discharged today AMA, stating "they weren't nice to me".   CURRENT/PAST OUTPATIENT TREATMENT: Pt endorses receiving counseling short-term in [place] years ago. No recent treatment. Unclear who is prescribing psychiatric medications. Denies having  local PCP or outpt team.     CURRENT/PAST DIAGNOSES: Per pt report, Bipolar, ADHD, PTSD, and Aspergers  CURRENT/PAST MENTAL HEALTH MEDICATIONS: Pt endorses taking medications as prescribed. Pt requests TW  confirm meds with RN.     SAFETY CONCERNS (risk/protective factors):  denies use of tobacco, alcohol or other illicit substances, family instability: no significant social supports locally, not sexually active, suicidal ideation/ depression  SUICIDALITY: Active and Plan no intent. Endorses chronic suicidality at baseline, usually when a stressor occurs. Describes SI as fleeting and decreases with support.  SUICIDE ATTEMPTS: Endorses one attempt at the age of 30 yo.  HOMICIDALITY: Denies    FIREARMS/WEAPONS ACCESS:  NO: denies access to firearms or weapons.    Substance Use History: denies use. Positive for barbituates which could be false positive d/t dilantin medication.  Substance Abuse History Indicated?: Patient Declined  Social History     Tobacco Use   . Smoking status: Not on file   . Smokeless tobacco: Not on file   Substance Use Topics   . Alcohol use: Not on file     History   Drug Use Not on file          Last BAL:   Alcohol (Ethyl)   Date Value Ref Range Status   02/16/2021 Negative NRN mg/dL Final     Last Urine Tox Screen:   Results for orders placed or performed during the hospital encounter of 02/16/21 (from the past 24 hour(s))   Emergency Drug Screen, Urine   Result Value Ref Range    Cannabinoids Qual, URN Negative NRN    Phencyclidine Qual, URN Negative NRN    Cocaine Qual, URN Negative NRN    Methamphetamine Qual, URN Negative NRN    Opiates Qual, URN Negative NRN    Amphetamine (Qual), URN Negative NRN    Benzodiazepines Qual, URN Negative NRN    Tricyclic Antidepressants Qual, URN Positive (A) NRN    Methadone Qual, URN Negative NRN    Barbiturate Qual, URN Positive (A) NRN    Oxycodone Qual, URN Negative NRN    Drug Screen Test Info, URN SEE NOTES        Medical History: Per patient and MD, seizure disorder controlled by medications. See MD note for more details.    Insurance coverage:   Insurance Information                MEDICARE/MEDICARE PART A AND B Phone: [phone]    Subscriber:  Antonio, Webb Subscriber#: [id number]    Group#: -- Precert#: --          MENTAL STATUS:  ORIENTATION: A&OX4    ATTITUDE:  Attentive and Cooperative  APPEARANCE: disheveled/unkempt; appropriate hygiene  PSYCHOMOTOR: with good eye contact and None  AFFECT:  Appropriate , euthymic and Appropriate   MOOD:  Appropriate and "tired"  SPEECH: Appropriate Rate and Rhythm  THOUGHT PROCESS: Linear and Sequential and concrete  THOUGHT CONTENT:  Appropriate  COGNITION:  Easily distracted, Within normal limits and Endorses being on the autism spectrum   JUDGEMENT:  Good  INSIGHT: Fair    Vegetative symptoms:  Sleep: "not very good" and "about 2 hours per night" for 2-3 months. Endorses this is partially due to being marginally housed and doesn't "trust shelters"  Appetite/weight: "good" endorses being at baseline. Observed requesting meals and drinks appropriately.  Energy: "low" for a 2-3 weeks.  Daily activities: Endorses completing ADLs independently. Moves between the [place] area and [place]. Decided to move to  [place] from out of state due to a mall he read about. Plans to return to [place] early July when he obtains funds by [place] to obtain birth certificate to transition to [place] to be with partner.    Principal Diagnosis: Mood Disorder NOS      Social Work Summary:  Impression: 30 yo male with a history of bipolar, PTSD, ADHD, and autism spectrum self-presents to [facility] ED for SI with a plan to hang self, in the context of a difficult interaction with his mother. Pt initially endorses SI with plan to hang self without intent and requesting voluntary hospitalization. After time in ED, pt endorses feeling supported, denies thoughts of SI, and is no longer interested in voluntary hospitalization. He is able to safety planning for short-term stay in [place], is future oriented, goal directed and feels reassured after speaking to partner on the phone. Pt requesting assistance with [place] [facility] and [place]pt [place]  to continue receiving supportive services with plan to return to [place] by [employer] early July. Pt would benefit from ongoing outpt services for further assessment and treatment of mental health and psychosocial needs.   Plan[place] [facility] [place] with bus tickets for NDA at [facility] at Wednesday at 12:30pm. SW available, PRN.  SW Interventions: Discharge planning;Consult with healthcare team;Mental health education, support, and resources provided;Transportation arranged  SW Referrals: Mental Health treatment and resources;Shelter/Housing    Marcellus Scott, MSW

## 2021-02-16 NOTE — Discharge Instructions (Addendum)
You have been evaluated by Antonio Webb. Antonio Webb, MSW.  You have agreed to follow-up with [facility] for a mental health appointment on Wednesday, 02/18/21 at 12:30pm. You agree that you are not currently feeling at risk of harming yourself or others.  If you begin to feel unable to keep yourself safe you can call the Crisis Clinic [phone]), 9-1-1, or return to this or any other emergency room.  You can also contact the National Suicide Prevention Lifeline 1-800-273-TALK 612-833-7675).    Mental Health appointment information:  [facility]  [address], Building [place], [place] [address]  Wednesday, 02/18/21 @ 12:30pm in-person  [phone]    Shelter information:  [facility] [address]. [facility]  [address]   [place], [place] [address]  [phone]

## 2021-02-16 NOTE — ED Provider Notes (Signed)
VISIT INFORMATION    Department: [facility]   Encounter Date: 02/16/2021        PATIENT IDENTIFICATION   Name: Antonio Webb ("Antonio Webb")   DOB: [birth year] (30 year old)  Sex: Male   MRN: [id number]       CHIEF COMPLAINT   Suicidal With [place] Haddix is a 30 year old male presenting for evaluation of suicidal ideation with plan.  Reports that he has been thinking about hanging himself.  Denies any self-injurious behavior specifically no ingestion.  He reportedly left against medical advice from a psychiatric inpatient facility today.  Reports a history of depression and prior suicidality.  Reports a previous suicide attempt when he was approximately 30 years old where he tried to hang himself.  Currently homeless.  No access to firearms.  I cannot elicit any other complaints.  Patient has a history of reportedly epilepsy and is compliant with his antiepileptics.  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       PROBLEM LIST   There is no problem list on file for this patient.          PAST MEDICAL AND SURGICAL HISTORY   He has no past medical history on file. He has no past surgical history on file.     Ports history of epilepsy, fecal and urinary incontinence, also history of depression and suicidality.     MEDICATIONS   He is not on any long-term medications.         SOCIAL HISTORY   He has no history on file for tobacco use. He has no history on file for alcohol use. He has no history on file for drug use.     Patient endorses smoking tobacco and cannabis.  Denies alcohol and other drug use.       FAMILY HISTORY   He denies any family history relevant to the presenting concern.       ALLERGIES   Bactrim [Sulfamethoxazole-Trimethoprim], Geodon [Ziprasidone], Haloperidol, and Latex        REVIEW OF SYSTEMS   A complete review of systems was performed and is negative except as noted  above.       PHYSICAL EXAM   Vital signs and nursing notes reviewed  Constitutional: Well appearing, nontoxic, no acute distress  Head: Normocephalic, Atraumatic  Eyes: Sclera anicteric, no conjunctival injection  ENT: Airway patent, no stridor  Neck: Supple  Chest: Lungs clear to auscultation bilaterally, non labored breathing  Cardiac: Regular rate and rhythm, no murmurs  Abdomen: Soft, nontender, nondistended  Musculoskeletal: Extremities without deformity or tenderness to palpation  Skin: No visible rash, normal skin tone  Neuro: Alert and oriented, conversant, grossly non-focal, moving all extremities, no overt cerebellar signs or incoordination  Psych: Endorses suicidal ideation with plan, denies homicidal ideation, no visual auditory hallucinations, calm and cooperative       EKG          LABS   Labs Reviewed  CBC (HEMOGRAM) - Abnormal    WBC                           6.40                  RBC  5.73 (*)              Hemoglobin                    16.3                  Hematocrit                    46                    MCV                           81                    MCH                           28.4                  MCHC                          35.4                  Platelet Count                198                   RDW-CV                        12.5                EMERGENCY DRUG SCREEN, URINE - Abnormal    Cannabinoids Qual, URN        Negative               Phencyclidine Qual, URN       Negative               Cocaine Qual, URN             Negative               Methamphetamine Qual, URN     Negative               Opiates Qual, URN             Negative               Amphetamine (Qual), URN       Negative               Benzodiazepines Qual, URN     Negative               Tricyclic Antidepressants Qual, URN   Positive (*)              Methadone Qual, URN           Negative               Barbiturate Qual, URN         Positive (*)              Oxycodone Qual, URN           Negative                Drug Screen Test Info, URN  SEE NOTES             ACETAMINOPHEN (TYLENOL)    Acetaminophen (Tylenol)       <10   ALCOHOL (ETHYL)    Alcohol (Ethyl)               Negative             COMPREHENSIVE METABOLIC PANEL    Sodium                        136                   Potassium                     3.8                   Chloride                      102                   Carbon Dioxide, Total         27                    Anion Gap                     7                     Glucose                       80                    Urea Nitrogen                 15                    Creatinine                    0.73                  Protein (Total)               7.1                   Albumin                       4.1                   Bilirubin (Total)             0.3                   Calcium                       9.6                   AST (GOT)                     21                    Alkaline Phosphatase (Total)   71  ALT (GPT)                     31                    eGFR by CKD-EPI               >60                 FIRST EXTRA URINE GRAY TOP  FIRST EXTRA URINE YELLOW TOP       IMAGING   No orders to display       PROCEDURES          TRAUMA/STROKE/STEMI ACTIVATION           SUICIDE RISK EVALUATION           SEPSIS          ED [place]   ED Course as of 02/16/21 1924  ------------------------------------------------------------  Time: 06/20 1647  Comment: Patient seen and evaluated. Patient is a 30 year old male presenting for evaluation of suicidal ideation with plan. The patient's concerns were acknowledged and addressed. They shared in the decision making of diagnostic studies and therapeutic interventions, with agreed-upon expectations and possible dispositions. Communicated the plan with nursing.   By: Dorthea Cove, MD  ------------------------------------------------------------  Time: 06/20 1710  Comment: Spoke with ED social work regarding  patient.  They will evaluate the patient.  By: Dorthea Cove, MD  ------------------------------------------------------------  Time: 06/20 1858  Comment: ED social work saw him I the patient.  Feel that the patient is safe for discharge.  They have provided patient with resources and follow-up.  By: Dorthea Cove, MD  ------------------------------------------------------------  Time: 06/20 1904  Comment: Patient reassessed. Patient well-appearing. No additional complaints or findings on exam. All diagnostics tests reviewed and discussed with the patient. Discussed plan for follow-up and strict return precautions.Patient in agreement with plan. Patient appears stable for discharge.   By: Dorthea Cove, MD        The patient was evaluated by the mental health team here today and deemed to not be a significant risk based on suicidal ideation/psychiatric instability.  The patient is able to contract for safety at this time.    Workup as listed did not show evidence for significant ingestion of imminently harmful substances.  He is labs were ordered in triage.  Had I seen the patient prior to this I would not have ordered these labs and the patient is not demonstrating any specific toxidrome is fairly clear that he has not had any ingestion.  Offered to fill any medications the patient however he declined stating he had an adequate supply.    The patient was observed over many hours with no events or changes. Repeatedly denied suicidality or homocidality to different providers prior to discharge.     Please see the crisis note/evaluation for additional information.  The patient is appropriate for outpatient followup and therapy according to the mental health team.  The patient was willing to contract for safety, agrees with this plan and will return if symptoms worsen or new symptoms arise.    Strict return precautions given. The importance of close follow up emphasized.         CLINICAL  IMPRESSION   The encounter diagnosis was Suicidal ideation.       DISPOSITION     Discharge  Dorthea Cove, MD  Resident  02/16/21 1924    Date of Service: 02/16/21  I saw and evaluated the patient. I have reviewed the resident(s)'s/fellow(s)'s documentation and agree.          Mertie Clause, MD  02/17/21 [phone]

## 2021-02-16 NOTE — ED Triage Notes (Addendum)
Pt reports thoughts of suicide today after speaking to mother on the phone. Plan to hang himself. Denies Homicidal thoughts. D/c 3 weeks ago from behavioral facility. Takes pysch meds. AAOx4, calm and appropriate.

## 2021-02-17 ENCOUNTER — Other Ambulatory Visit: Payer: Self-pay

## 2021-02-17 ENCOUNTER — Emergency Department
Admission: EM | Admit: 2021-02-17 | Discharge: 2021-02-17 | Disposition: A | Discharge status: Home / Self Care | Payer: Medicare Other | Attending: Emergency Medical Services | Admitting: Emergency Medical Services

## 2021-02-17 DIAGNOSIS — Z9151 Personal history of suicidal behavior: Secondary | ICD-10-CM | POA: Insufficient documentation

## 2021-02-17 DIAGNOSIS — Z5900 Homelessness unspecified: Secondary | ICD-10-CM | POA: Insufficient documentation

## 2021-02-17 DIAGNOSIS — Z79899 Other long term (current) drug therapy: Secondary | ICD-10-CM | POA: Insufficient documentation

## 2021-02-17 DIAGNOSIS — Z20822 Contact with and (suspected) exposure to covid-19: Secondary | ICD-10-CM | POA: Insufficient documentation

## 2021-02-17 DIAGNOSIS — F32A Depression, unspecified: Secondary | ICD-10-CM | POA: Insufficient documentation

## 2021-02-17 DIAGNOSIS — Z881 Allergy status to other antibiotic agents status: Secondary | ICD-10-CM | POA: Insufficient documentation

## 2021-02-17 DIAGNOSIS — G40909 Epilepsy, unspecified, not intractable, without status epilepticus: Secondary | ICD-10-CM | POA: Insufficient documentation

## 2021-02-17 DIAGNOSIS — Z008 Encounter for other general examination: Secondary | ICD-10-CM

## 2021-02-17 DIAGNOSIS — R45851 Suicidal ideations: Secondary | ICD-10-CM | POA: Insufficient documentation

## 2021-02-17 LAB — 2ND EXTRA PEARL TOP

## 2021-02-17 LAB — COMPREHENSIVE METABOLIC PANEL
ALT (GPT): 30 U/L (ref 10–64)
AST (GOT): 18 U/L (ref 9–38)
Albumin: 3.9 g/dL (ref 3.5–5.2)
Alkaline Phosphatase (Total): 64 U/L (ref 35–109)
Anion Gap: 8 (ref 4–12)
Bilirubin (Total): 0.3 mg/dL (ref 0.2–1.3)
Calcium: 9.3 mg/dL (ref 8.9–10.2)
Carbon Dioxide, Total: 26 meq/L (ref 22–32)
Chloride: 103 meq/L (ref 98–108)
Creatinine: 0.77 mg/dL (ref 0.51–1.18)
Glucose: 101 mg/dL (ref 62–125)
Potassium: 3.7 meq/L (ref 3.6–5.2)
Protein (Total): 6.9 g/dL (ref 6.0–8.2)
Sodium: 137 meq/L (ref 135–145)
Urea Nitrogen: 15 mg/dL (ref 8–21)
eGFR by CKD-EPI: >60 mL/min/{1.73_m2} (ref >59)

## 2021-02-17 LAB — CBC, DIFF
% Basophils: 1 %
% Eosinophils: 2 %
% Immature Granulocytes: 0 %
% Lymphocytes: 27 %
% Monocytes: 9 %
% Neutrophils: 61 %
% Nucleated RBC: 0 %
Absolute Eosinophil Count: 0.1 10*3/uL (ref 0.00–0.50)
Absolute Lymphocyte Count: 1.55 10*3/uL (ref 1.00–4.80)
Basophils: 0.04 10*3/uL (ref 0.00–0.20)
Hematocrit: 46 % (ref 38.0–50.0)
Hemoglobin: 16 g/dL (ref 13.0–18.0)
Immature Granulocytes: 0.01 10*3/uL (ref 0.00–0.05)
MCH: 28.8 pg (ref 27.3–33.6)
MCHC: 34.6 g/dL (ref 32.2–36.5)
MCV: 83 fL (ref 81–98)
Monocytes: 0.51 10*3/uL (ref 0.00–0.80)
Neutrophils: 3.59 10*3/uL (ref 1.80–7.00)
Nucleated RBC: 0 10*3/uL
Platelet Count: 185 10*3/uL (ref 150–400)
RBC: 5.55 10*6/uL (ref 4.40–5.60)
RDW-CV: 12.6 % (ref 11.6–14.4)
WBC: 5.8 10*3/uL (ref 4.3–10.0)

## 2021-02-17 LAB — STANDARD DRUG SCREEN, URN
Acetaminophen Qualitative, URN: NEGATIVE
Alcohol (Ethyl), URN: NEGATIVE mg/dL
Amphet/Methamphetamine Qual,URN: NEGATIVE
Barbiturate (Qual), URN: NEGATIVE
Benzodiazepines (Qual), URN: NEGATIVE
Cannabinoids (Qual), URN: NEGATIVE
Cocaine (Qual), URN: NEGATIVE
Fentanyl Qual, URN: NEGATIVE
Methadone (Qual), URN: NEGATIVE
Opiates (Qual), URN: NEGATIVE
Phencyclidine (Qual), URN: NEGATIVE
Tricyclic Antidepressants, URN: NEGATIVE

## 2021-02-17 LAB — ALCOHOL (ETHYL): Alcohol (Ethyl): NEGATIVE mg/dL

## 2021-02-17 LAB — SARS-COV-2 (COVID-19) QUALITATIVE RAPID PCR: COVID-19 Coronavirus Qual PCR Result: NOT DETECTED

## 2021-02-17 LAB — 1ST EXTRA GOLD TOP

## 2021-02-17 LAB — 1ST EXTRA PEARL TOP

## 2021-02-17 MED ORDER — PHENYTOIN SODIUM EXTENDED 100 MG OR CAPS
300.0000 mg | ORAL_CAPSULE | Freq: Once | ORAL | Status: AC
Start: 2021-02-17 — End: 2021-02-17
  Administered 2021-02-17: 300 mg via ORAL
  Filled 2021-02-17: qty 3

## 2021-02-17 NOTE — ED Triage Notes (Signed)
Pt arrived wi c/o SI plan to hang himself. States he has hung himself in the past. PMH: Bipolar, autism, PTSD. Calm and cooperative in triage.

## 2021-02-17 NOTE — ED Provider Notes (Cosign Needed)
CHIEF COMPLAINT   Chief Complaint   Patient presents with   . Suicidal With A Plan            HISTORY OF PRESENT ILLNESS   HPI  Patient is a 30-year-old male with reported history of seizures or numbers of depression presenting to the emergency department reporting suicidal ideation with a plan to hang himself.  Has a history of same with attempted to hang himself at age 18.  Denies HI or auditory hallucinations.  Patient was seen earlier today in [facility] ED and discharged after social work eval.  Patient reports coming here because he continues to feel unsafe.  Denies medical complaints.  Denies COVID symptoms.  Denies chest pain, shortness of breath, cough, fevers, chills, URI symptoms.  No recent drug use or alcohol use.  He is currently homeless does not stay in shelters.    Patient reports taking Seroquel 300 mg nightly as well as Dilantin 300 mg nightly for his seizure disorder.  He has not taken his Dilantin tonight and asks for dose here.  He does report that he otherwise has his medications in his possession.         PAST MEDICAL AND SURGICAL HISTORY       Seizure disorder  Depression                                     MEDICATIONS AND ALLERGIES     OUTPATIENT MEDICATIONS:   No current outpatient medications    ALLERGIES:   Bactrim [sulfamethoxazole-trimethoprim], Geodon [ziprasidone], Haloperidol, and Latex          SOCIAL HISTORY        Denies tobacco, alcohol, illicits.  Homeless.                 PAST FAMILY HISTORY                          REVIEW OF SYSTEMS   Review of Systems   Constitutional: Negative for fever and chills.   HENT: Negative for congestion.    Respiratory: Negative for shortness of breath.    Cardiovascular: Negative for chest pain.   Gastrointestinal: Negative for nausea and vomiting.   Genitourinary: Negative for dysuria.   Musculoskeletal: Negative for myalgias.   Skin: Negative for pallor.   Neurological: Negative for headaches.   Psychiatric/Behavioral: Positive for suicidal  ideas.             PHYSICAL EXAM   ED VITALS:  Vitals (Arrival)      T: 36.7 C (02/17/21 0046)  BP: 128/60 (02/17/21 0046)  HR: 92 (02/17/21 0046)  RR: 16 (02/17/21 0046)  SpO2: 95 % (02/17/21 0046) Room air   Vitals (Most recent in last 24 hrs)   T: 36.7 C (02/17/21 0046)  BP: 130/64 (02/17/21 0530)  HR: 82 (02/17/21 0530)  RR: 17 (02/17/21 0530)  SpO2: 96 % (02/17/21 0530) Room air  T range: Temp  Min: 36.3 C  Max: 36.7 C  (no weight taken for this visit)     (no height taken for this visit)     There is no height or weight on file to calculate BMI.           Physical Exam  Constitutional:       Comments: Alert young male sitting up in stretcher, pleasant, no acute distress     HENT:      Head: Normocephalic and atraumatic.      Right Ear: External ear normal.      Left Ear: External ear normal.      Nose: Nose normal.      Mouth/Throat:      Mouth: Mucous membranes are moist.      Pharynx: Oropharynx is clear.   Eyes:      Pupils: Pupils are equal, round, and reactive to light.   Neck:      Musculoskeletal: Normal range of motion.   Cardiovascular:      Rate and Rhythm: Normal rate and regular rhythm.      Pulses: Normal pulses.      Heart sounds: Normal heart sounds.   Pulmonary:      Effort: Pulmonary effort is normal.      Breath sounds: Normal breath sounds.   Abdominal:      General: Abdomen is flat.      Palpations: Abdomen is soft.   Musculoskeletal:         General: Normal range of motion.   Skin:     General: Skin is warm and dry.      Capillary Refill: Capillary refill takes less than 2 seconds.   Neurological:      General: No focal deficit present.      Mental Status: He is oriented to person, place, and time.   Psychiatric:         Mood and Affect: Mood normal.         Behavior: Behavior normal.               LABORATORY:   Labs Reviewed   CBC, DIFF       Result Value    WBC 5.80      RBC 5.55      Hemoglobin 16.0      Hematocrit 46      MCV 83      MCH 28.8      MCHC 34.6      Platelet Count 185       RDW-CV 12.6      % Neutrophils 61      % Lymphocytes 27      % Monocytes 9      % Eosinophils 2      % Basophils 1      % Immature Granulocytes 0      Neutrophils 3.59      Absolute Lymphocyte Count 1.55      Monocytes 0.51      Absolute Eosinophil Count 0.10      Basophils 0.04      Immature Granulocytes 0.01      Nucleated RBC 0.00      % Nucleated RBC 0     COMPREHENSIVE METABOLIC PANEL    Sodium 137      Potassium 3.7      Chloride 103      Carbon Dioxide, Total 26      Anion Gap 8      Glucose 101      Urea Nitrogen 15      Creatinine 0.77      Protein (Total) 6.9      Albumin 3.9      Bilirubin (Total) 0.3      Calcium 9.3      AST (GOT) 18      Alkaline Phosphatase (Total) 64      ALT (GPT) 30        eGFR by CKD-EPI >60     ALCOHOL (ETHYL)    Alcohol (Ethyl) Negative     STANDARD DRUG SCREEN, URN    Amphet/Methamphetamine Qual,URN Negative      Barbiturate (Qual), URN Negative      Benzodiazepines (Qual), URN Negative      Cocaine (Qual), URN Negative      Alcohol (Ethyl), URN Negative      Methadone (Qual), URN Negative      Opiates (Qual), URN Negative      Phencyclidine (Qual), URN Negative      Cannabinoids (Qual), URN Negative      Tricyclic Antidepressants, URN Negative      Acetaminophen Qualitative, URN Negative      Fentanyl Qual, URN Negative      Drug Screen Test Info, URN SEE NOTES     SARS-COV-2 (COVID-19) QUALITATIVE RAPID PCR    COVID-19 Coronavirus Qual PCR Specimen Type Nasal swab      COVID-19 Coronavirus Qual PCR Result None detected      COVID-19 Coronavirus Qual PCR Interpretation        Value: This is a negative result. Laboratory testing alone cannot rule out infection, particularly in the presence of clinical risk factors such as symptoms or exposure history.    COVID-19 Qualitative PCR Indication Admission Surveillance     2ND EXTRA PEARL TOP    2nd Extra Pearl Top Additional collection tube     FIRST EXTRA URINE GRAY TOP         IMAGING:     ED Wet Read -   No orders to display        Radiology Final Result -   No image results found.              EKG DOCUMENTATION      No EKG was performed           TRAUMA/STROKE/STEMI ACTIVATION                 SUICIDE RISK EVALUATION                 SEPSIS               4TH  YEAR RESIDENT            ED COURSE/MEDICAL DECISION MAKING          Patient is a 30 year old male who presents emergency department reporting suicidal ideation with a plan to hang himself.  On arrival to the ED the patient is afebrile, ambulatory, no acute distress.  Vital signs are within normal limits for age.  Patient is calm and cooperative.  No overt toxidrome or evidence of intoxication.  No recent trauma.  Patient has no current medical complaints with the exception of requesting his evening dose of Dilantin.    Screening labs were obtained which are normal.  Urine drug screen ordered and pending.  I discussed the patient with psychiatric emergency services given his ongoing suicidal ideation and they will evaluate in the main ED.    Patient seen by psychiatric emergency services, cleared, will be discharged with PCP follow-up and outpatient mental health appointment later this week.               CLINICAL IMPRESSION/DISPOSITION   Clinical Impressions:   [R45.851] Suicidal ideation         Disposition: Discharge         CRITICAL CARE - ATTENDING ONLY   Critical Care - No  Critical Care           ADDITIONAL INFORMATION REVIEWED  - ATTENDING ONLY           Boehrer, Alexandra, MD  Resident  02/17/21 0559

## 2021-02-17 NOTE — Discharge Instructions (Signed)
-   You have an appointment at [facility] on Wednesday at 12:30pm   [address]   [place], [place] [address]    [phone]  - You have also been provided with shelter resources and bus tickets.  - Please call 911 or go to the nearest ED for any safety concerns including thoughts of self harm, suicide, and worsening of your symptoms.  - Crisis line: [phone]

## 2021-02-17 NOTE — Progress Notes (Addendum)
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  PES SW referred to assist with care coordination.     Chief Complaint   Patient presents with   . Suicidal With A Plan     Identifying data/reason for referral:  Referral Source: Physician  Referral Reason: Other (Comment) (Care coordination)    Social Work Summary:  HPI:  PES SW contacted the [facility] [phone]) to obtain psych background info.  They had no record of the pt.  He had been admitted voluntarily to [facility] recently so he would not show up in their system.  While at [facility] ED at 20:00 on 06/20, the pt had been given a 12:30 NDA for Wed at [facility].  SW then contacted [facility] after-hours clinician Belenda Cruise, who reports no NDA documented for the pt.  The pt's NDA was obtain @ 20:00 yesterday so it is understandable that it is not in the system yet.  Pt was assessed by PES provider.  PES SW consulted with care team regarding patient needs and treatment plan. Pt NOT appropriate for voluntary inpatient psych treatment.    Pt was considered for CDF, but their queue was at 6, which is too long for referral at this time.  SW spoke with the pt about his lost ID and his plans for obtaining shelter and food.  The pt reports that he can get into shelters without his ID, which is not the actual case.  Pt was prompted to visit one of the day centers listed on the shelter resource sheet provided to him to inquire about food resources.  He was also told to follow up with his [facility] appointment on Wednesday so that they could assist him with potentially getting a form of ID.  Pt reports that his ID is from [place] and that  he doesn't need it to get back to [place], where he is planning to go once his next SSDI funds activate.  He stated that he needs a safe place to be until then.      Pt reported that he is not feeling like he wanted to hurt himself and that he wanted to be removed from his restraints.  He began asking what other hospitals in the area have inpatient psychiatric units.  He was advised of other hospitals with emergency departments, but informed that he could not just show up to an inpatient psych facility and ask to be admitted.  He was reminded to first try to access shelter in the community after visiting a day shelter.  Pt agreed to do so.  Pt was aware that he can call the crisis line or return to an ED if he is feeling unsafe.  Pt does have a functional cell phone.  He was provided a bus ticket for safe transport to day shelter.          Social History:  Living situation prior to admit: Homeless  Support system: Spouse/significant other    Psychiatric History:              Substance Use History:  Substance Abuse History Indicated?: Not Indicated  Social History     Tobacco Use   . Smoking status: Not on file   . Smokeless tobacco: Not on file   Substance Use Topics   . 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  Pt was assessed by PES provider.  PES SW consulted with care team regarding patient needs and treatment plan.  Pt deemed appropriate for discharge.         Plan:  Pt discharged from medical ED with resources.          Reinaldo Raddle [facility]  Psychiatric Emergency Services Social Work  [employer]  [phone]  SW Emergency department services (minutes): 60
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
## 2021-02-24 NOTE — ED Triage Notes (Signed)
Pt to ER with report of abdominal pain beginning after eating hotdog at baseball game.

## 2021-02-26 ENCOUNTER — Ambulatory Visit (HOSPITAL_BASED_OUTPATIENT_CLINIC_OR_DEPARTMENT_OTHER): Payer: Medicare Other | Admitting: Clinical

## 2021-02-26 ENCOUNTER — Encounter (HOSPITAL_BASED_OUTPATIENT_CLINIC_OR_DEPARTMENT_OTHER): Payer: Self-pay

## 2021-02-26 NOTE — Progress Notes (Signed)
Antonio Webb did not cancel and was not present for a scheduled first Next Day Crisis appointment (NDA) today.  Disposition: Chart reviewed, patient contacted by phone regarding follow-up. Phone rang repeatedly, no answer and did not go to VM. Unable to leave a VM. Pt needs to call the [facility] to reschedule this missed appt.

## 2022-06-21 ENCOUNTER — Emergency Department
Admission: EM | Admit: 2022-06-21 | Discharge: 2022-06-22 | Disposition: A | Discharge status: Home / Self Care | Payer: Self-pay | Source: Ambulatory Visit | Attending: Psychiatry | Admitting: Psychiatry

## 2022-06-21 DIAGNOSIS — Z5901 Sheltered unhousedness: Secondary | ICD-10-CM | POA: Insufficient documentation

## 2022-06-21 DIAGNOSIS — Z59819 Housing instability, housed unspecified: Secondary | ICD-10-CM

## 2022-06-21 DIAGNOSIS — R569 Unspecified convulsions: Secondary | ICD-10-CM

## 2022-06-21 DIAGNOSIS — F39 Unspecified mood [affective] disorder: Secondary | ICD-10-CM | POA: Insufficient documentation

## 2022-06-21 DIAGNOSIS — Z9151 Personal history of suicidal behavior: Secondary | ICD-10-CM | POA: Insufficient documentation

## 2022-06-21 DIAGNOSIS — Z79899 Other long term (current) drug therapy: Secondary | ICD-10-CM | POA: Insufficient documentation

## 2022-06-21 LAB — DRUG SCREEN CHEMICAL DEPENDENCY, URINE
Amphetamine,UR: NEGATIVE
Benzodiazepinen,UR: NEGATIVE
Cocaine/Metab,UR: NEGATIVE
Fentanyl, UR: NEGATIVE ng/mL
Opiates,UR: NEGATIVE
THC Metabolite,UR: NEGATIVE

## 2022-06-21 MED ORDER — ACETAMINOPHEN 325 MG PO TABS *I*
650.0000 mg | ORAL_TABLET | Freq: Once | ORAL | Status: AC
Start: 2022-06-21 — End: 2022-06-21
  Administered 2022-06-21: 650 mg via ORAL
  Filled 2022-06-21: qty 2

## 2022-06-21 NOTE — CPEP Notes (Signed)
Clinical Evaluator Collateral Note     Personal:   Name: Sam, Relationship: Friend, Phone: [phone]      Writer called and spoke to Sam. He reports speaking with patient earlier today and she did not appear to be distressed. Patient resides in a homeless shelter. Sam reports he does not have any safety concerns. He is not sure if patient is connected to any outpatient MH treatment providers. Sam believes that patient does not has any hx of SA. Sam reports that he does not know if patient has any stressors. Denies violence, substance use, and access to firearms.       Demyah Smyre, LMSW, 7:48 PM
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
## 2022-06-21 NOTE — CPEP Notes (Signed)
I stop:   Confidential Drug Report  Search Terms: Mabry Borowski, March 08, 1991  Search Date: 06/21/2022 13:40:08 PM  The Drug Utilization Report below displays all of the controlled substance prescriptions, if any, that your patient has filled in the last twelve months. The information displayed on this report is compiled from pharmacy submissions to the Department, and accurately reflects the information as submitted by the pharmacies.  This report was requested by: Robynn Pane  Reference #: [id number]  There are no results for the search terms that you entered.        Robynn Pane, NP  06/21/22 1340
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
## 2022-06-21 NOTE — CPEP Notes (Signed)
Clinical Evaluator Collateral Note    Personal:   Name: Sam, Relationship: Friend, Phone: [phone]      Writer called and left a voicemail requesting a call back for collateral.     Charley Lafrance, LMSW, 5:08 PM
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
## 2022-06-21 NOTE — First Provider Contact (Signed)
CPEP Provider Triage and Referral Note     CPEP initial provider evaluation performed by   CPEP Provider Initial Contact       Date/Time Event User Comments    06/21/22 1231 CPEP Provider Initial Contact Samuel Fry --             Chief Complaint / HPI and Relevant Past History:     Chief Complaint   Patient presents with    Suicidal   .    Patient presented to CPEP on 06/21/2022  5:55 AM voluntarily with the above chief complaint.. This is a 31 year old transgender male (preferred pronouns she/her/hers), with reported history of seizures, with self-reported PPH of PTSD, ADHD, bipolar disorder, no prior [facility] CPEP presentations or prior [facility] inpatient psychiatric admissions, who presented to the hospital due to SI. While Samuel Fry has been in CPEP, she has been in behavioral control and has not necessitated the use of IM STAT medications or restraints. She has been interacting appropriately with staff and peers. She is seen this AM in CPEP; she is found seated in the milieu and is asking everyone what they think of her "jammies". She continues to endorse SI. Denies other concerns. Discussed with charge nurse that patient had reported a history of seizures and does not remember what medication she takes. Will place patient on seizure precautions and RN will attempt to verify medications from pharmacy. Will require evaluation to assess safety and risks    Home Medications:     Prior to Admission medications    Not on File       Vital Signs   Reviewed:  Last Filed Vitals    06/21/22 1210   BP: 125/64   Pulse: 79   Resp: 20   Temp: 36.2 C (97.2 F)   SpO2: 95%        MSE   Mental Status Exam  Appearance: Groomed  Relationship to Interviewer: Cooperative  Psychomotor Activity: Normal  Abnormal Movements: None  Muscle Strength and Tone: Normal  Station/Gait : Normal  Speech : Regular rate, Normal tone, Normal rhythm  Language: Fluent  Mood: Euthymic  Affect: Appropriate  Thought Process: Logical  Thought Content: No  homicidal ideation, Suicidal ideation  Perceptions/Associations : No hallucinations  Sensorium: Alert  Cognition: Recent memory intact  Fund of Knowledge: Normal  Insight : Fair  Judgement: Fair      Labs:     Recent Results (from the past 24 hour(s))   Drug screen chemical dependency, urine    Collection Time: 06/21/22  6:42 AM   Result Value Ref Range    Amphetamine,UR NEG     Cocaine/Metab,UR NEG     Benzodiazepinen,UR NEG     Opiates,UR NEG     THC Metabolite,UR NEG     Fentanyl, UR NEG ng/mL    Remark,UR See text          Initial Diagnosis:     Final diagnoses:   Mood disorder   Housing insecurity        Assessment of Scope of Emergency Services Required: Needs admission to CPEP for further evaluation and treatment.     Initial Assessment   In my clinical opinion, based on the above documented information, patient requires further evaluation and treatment and is therefore admitted to CPEP on voluntary status, to complete a full CPEP evaluation, with the plan below.    CPEP Plan:   MD/NP:  MD/NP to do: no action items at this time  RN:  RN to do: report back to MD    Clinical evaluator:  Clinical Evaluator to do : outpatient mental health appointment/info     Collateral information from current providers, family or natural supports, other sources of collateral information if necessary (please specify)     Samuel Rehfeld, MD, 06/21/2022, 12:32 PM     Samuel Gay, MD  06/21/22 1241
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
## 2022-06-21 NOTE — CPEP Notes (Signed)
FAX request of records to [facility] at [phone]    Pt reports last seizure a couple months ago. Has been getting Dilantin for seizures, but "I've been getting from different ER's." Has not linked with outpatient providers.

## 2022-06-22 ENCOUNTER — Other Ambulatory Visit: Payer: Self-pay

## 2022-06-22 MED ORDER — PHENYTOIN SODIUM EXTENDED 300 MG PO CAPS *A*
300.0000 mg | ORAL_CAPSULE | Freq: Every evening | ORAL | 0 refills | Status: AC
Start: 2022-06-22 — End: 2022-07-22
  Filled 2022-06-22: qty 30, 30d supply, fill #0

## 2022-06-22 NOTE — CPEP Notes (Signed)
[facility] SOCIAL WORK  PATIENT CARE FUNDS REQUEST FORM  Today's date:  June 22, 2022      Patient/Family Name: Samuel Fry Medical Record #: [id number]  DOB: July 02, 1991   Patient's Address[id number]  Social Worker: Cecilie Kicks, LMSW  Date of Service:   Clinical Area: CPEP / ED   Estimated Cost: 22.10  Type of need:  []  Transportation   []  Food/Meal voucher   [x]  Pharmacy []  Lodging    []  Medical equipment     []  Other                            (Specify/Describe)    Vendor          Description of need:  [employer]    Resources Contacted  [x]  Lack of Resources    []  DSS  []  Lack of Support    []  Utility Company  []  Necessary for D/C    []  Transport planner  []  Preventive     []  Other    Justification of Need: from out of [place] and does not have active Marine scientist (forward form to Merchandiser, retail for approval)  [x]  [employer]       []  [employer]   []  [employer]      []  [employer]  []  [employer]      []  [employer]   []  [employer]     []  [employer]   []  Referred to other source          []  [employer]          Social work signature: Cecilie Kicks, LMSW    Supervisor/Manager Approval:    Date:       (supervisor signature not required for Medicaid pending)

## 2022-06-22 NOTE — Discharge Instructions (Addendum)
CPEP Discharge Instructions    Discharge Date: 06/22/2022    Discharge Time:   5:35 AM     Follow-ups:      You have declined a referral for follow up Mental Health treatment.  If you should change your mind, you can use the list provided to pursue mental health treatment:    Adult Walk-in Mental Health Services    Instructions:  Please attend ONE of the walk-in services listed below and inform them you were discharged from CPEP and need to begin the intake process.    **Please plan your time accordingly. Average intake time is 2 hours to complete.  **Please bring your insurance information to the intake appointment.      [facility]   [address] [place], [place] [address]   Phone: [phone]   WALK IN HOURS: Tuesdays, Wednesdays, and Thursdays from 9am-11am      [facility]   [address] [place]   [place], [place] [address]  Phone: [phone]   WALK IN HOURS: Mondays, Tuesdays, Thursdays from 8:30AM to [facility]  [address]  [place], [place] [address]  Phone: [phone]  WALK IN HOURS: Thursday mornings 8:00am-9:30am      [facility]  [address]  [place], [place] [address]   Phone: [phone]  WALK IN HOURS: Monday - Friday - 9am-2pm      [facility]   [address], [place], [place] [address]  Phone: [phone]  WALK IN HOURS: Tuesdays 8:30am-9:30am and Wednesdays 10:30am-11:30am      [facility]   [address] [place], [place], [place] [address]  Phone: [phone]  WALK IN HOURS: Wednesdays 8:30am-10:30am and Thursdays 8:30am-10:30am       [facility]  [address], [place], [place] [address]  Phone: [phone]  WALK IN HOURS: Tuesdays 8:00am-10:00am       [facility]   [address], [place], [place] [address]  Phone:  [phone]  WALK IN HOURS: Tuesdays 12:30pm-2:30pm and Thursdays 8:30am-10:30am        [facility]  Access and Crisis Center  [address], Entrance at [address]  [place], [place] [address]  Phone: [phone]  This Crisis Center can link you to an assigned therapist at [facility], [facility] or [facility] for on-going therapy, if needed  You can use this Crisis Center as needed for mental health support   WALK IN HOURS:   Monday: 9:00 am - 9:00 pm  Tuesday: 9:00 am - 9:00 pm  Wednesday: 9:00 am - 9:00 pm  Thursday: 9:00 am - 9:00 pm  Friday: 9:00 am - 9:00 pm  Saturday: 9:00 am - 9:00 pm  Sunday: 9:00 am - 9:00 pm        Effective 06/16/2022      The clinical team recommends the following actions for creating a safe environment post discharge utilize options as identified in completed safety plan , reach out to family and/or friends for support , and take medications as prescribed     Level of outreach indicated if patient fails new intake or COPS (Comprehensive Outpatient Psychiatric Service) appointment: Routine Program Follow-up    When to call for help:    Call your psychiatric outpatient provider if experiencing  any of these symptoms: increased irritability, sleep changes, appetite changes, energy changes, thoughts to harm yourself or others, anxiety, fear, auditory or visual hallucinations.  [facility] Helpline (24 hours/7 days) [phone] Consulting civil engineer)  Mobile Crisis team: [phone]    General Instructions:  Other written information given to the patient: No  Return to Work/School on: 06/22/2022           The above information has been discussed with me and I have received a copy.  I understand that I am advised to follow the instructions given to me to appropriately care for my condition.

## 2022-06-22 NOTE — ED Notes (Signed)
06/22/22 0452   Disposition details   Patient is being discharged to community   Family notification done via phone message   Name of family member notified Lavonia Drafts, mother   Outpatient provider notification of disposition patient has no outpatient provider   Living situation: notification of disposition NA-homeless   Transportation arranged via cab (voucher)   Patient belongings  given to patient   Medications NA- patient has none   Valuables NA- patient has none

## 2022-06-22 NOTE — ED Notes (Signed)
06/22/22 0450   CPEP Summary of Services   Safety precautions implemented personal belongings secured   Case consultation/discussion involving attending;registered nurse;social worker   Crisis intervention and safety planning involving verbal intervention;access to firearms discussed and denied;safety plan #1 developed (comment)   Collateral information obtained from family member(s)   Referral offered, accepted and completed for  No additional referrals needed   Referral(s) offered but declined by patient for Outpatient Mental Health services;community supports
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
## 2022-06-23 ENCOUNTER — Emergency Department
Admission: EM | Admit: 2022-06-23 | Discharge: 2022-06-23 | Disposition: A | Discharge status: Home / Self Care | Payer: Self-pay | Source: Ambulatory Visit | Attending: Emergency Medicine | Admitting: Emergency Medicine

## 2022-06-23 DIAGNOSIS — E669 Obesity, unspecified: Secondary | ICD-10-CM

## 2022-06-23 DIAGNOSIS — R1084 Generalized abdominal pain: Secondary | ICD-10-CM

## 2022-06-23 DIAGNOSIS — R109 Unspecified abdominal pain: Secondary | ICD-10-CM | POA: Insufficient documentation

## 2022-06-23 LAB — CBC AND DIFFERENTIAL
Baso # K/uL: 0 10*3/uL (ref 0.0–0.2)
Basophil %: 0.8 %
Eos # K/uL: 0.1 10*3/uL (ref 0.0–0.5)
Eosinophil %: 2.1 %
Hematocrit: 47 % (ref 37–52)
Hemoglobin: 16.3 g/dL (ref 12.0–17.0)
IMM Granulocytes #: 0 10*3/uL (ref 0.0–0.0)
IMM Granulocytes: 0.2 %
Lymph # K/uL: 1.4 10*3/uL (ref 1.0–5.0)
Lymphocyte %: 26.8 %
MCH: 29 pg (ref 26–32)
MCHC: 35 g/dL (ref 32–37)
MCV: 82 fL (ref 75–100)
Mono # K/uL: 0.5 10*3/uL (ref 0.1–1.0)
Monocyte %: 9.4 %
Neut # K/uL: 3.2 10*3/uL (ref 1.5–6.5)
Nucl RBC # K/uL: 0 10*3/uL (ref 0.0–0.0)
Nucl RBC %: 0 /100 WBC (ref 0.0–0.2)
Platelets: 200 10*3/uL (ref 150–450)
RBC: 5.7 MIL/uL (ref 4.0–6.0)
RDW: 13.7 % (ref 0.0–15.0)
Seg Neut %: 60.7 %
WBC: 5.2 10*3/uL (ref 3.5–11.0)

## 2022-06-23 LAB — BASIC METABOLIC PANEL
Anion Gap: 12 (ref 7–16)
CO2: 24 mmol/L (ref 20–28)
Calcium: 9.3 mg/dL (ref 9.0–10.3)
Chloride: 104 mmol/L (ref 96–108)
Creatinine: 0.84 mg/dL (ref 0.67–1.17)
Glucose: 106 mg/dL — ABNORMAL HIGH (ref 60–99)
Lab: 15 mg/dL (ref 6–20)
Potassium: 3.9 mmol/L (ref 3.3–5.1)
Sodium: 140 mmol/L (ref 133–145)
eGFR BY CREAT: 121 *

## 2022-06-23 LAB — RUQ PANEL (ED ONLY)
ALT: 51 U/L — ABNORMAL HIGH (ref 0–50)
AST: 26 U/L (ref 0–50)
Albumin: 4.2 g/dL (ref 3.5–5.2)
Alk Phos: 81 U/L (ref 40–130)
Amylase: 54 U/L (ref 28–100)
Bilirubin,Direct: <0.2 mg/dL (ref 0.0–0.3)
Bilirubin,Total: 0.3 mg/dL (ref 0.0–1.2)
Lipase: 55 U/L (ref 13–60)
Total Protein: 7.2 g/dL (ref 6.3–7.7)

## 2022-06-23 MED ORDER — DEXTROSE 5 % FLUSH FOR PUMPS *I*
0.0000 mL/h | INTRAVENOUS | Status: DC | PRN
Start: 2022-06-23 — End: 2022-06-23

## 2022-06-23 MED ORDER — SODIUM CHLORIDE 0.9 % FLUSH FOR PUMPS *I*
0.0000 mL/h | INTRAVENOUS | Status: DC | PRN
Start: 2022-06-23 — End: 2022-06-23

## 2022-06-23 NOTE — ED Provider Notes (Signed)
History     Chief Complaint   Patient presents with    Abdominal Pain     31 year old transgender male the pain in the abdomen was presenting to the ED with chief complaint of abdominal discomfort.  Patient stating that there was some significant abdominal pain this morning.  Patient states that pain was all over the abdomen.  Patient denies any nausea or vomiting.  Patient denies any fevers or chills.  Patient stating that she feels the pain is coming from not eating.  Patient denying any other complaints.      History provided by:  Patient        Medical/Surgical/Family History     Past Medical History:   Diagnosis Date    Seizures         Patient Active Problem List   Diagnosis Code    Seizures R56.9            History reviewed. No pertinent surgical history.       Social History     Substance Use Topics    Alcohol use: Not Currently     Comment: denies    Drug use: Not Currently     Comment: denies             Review of Systems   Constitutional:  Negative for activity change, appetite change, chills and fever.   HENT:  Negative for congestion, rhinorrhea, sore throat and trouble swallowing.    Eyes:  Negative for pain and visual disturbance.   Respiratory:  Negative for cough, chest tightness, shortness of breath and wheezing.    Cardiovascular:  Negative for chest pain.   Gastrointestinal:  Positive for abdominal pain. Negative for nausea and vomiting.   Genitourinary:  Negative for dysuria.   Musculoskeletal:  Negative for back pain, neck pain and neck stiffness.   Skin:  Negative for rash.   Neurological:  Negative for dizziness, seizures, syncope, weakness, light-headedness, numbness and headaches.   Psychiatric/Behavioral:  Negative for agitation.        Physical Exam     Triage Vitals  Triage Start: Start, (06/23/22 1610)  First Recorded BP: (!) 124/92, Resp: 16, Temp: 36.1 C (97 F), Temp src: TEMPORAL Oxygen Therapy SpO2: 97 %, Oximetry Source: Rt Hand, O2 Device: None (Room air), Heart Rate: 97,  (06/23/22 0926) Heart Rate (via Pulse Ox): 91, (06/23/22 1209).  First Pain Reported  0-10 Scale: 10, Pain Location/Orientation: Abdomen, (06/23/22 9604)       Physical Exam  Vitals and nursing note reviewed.   Constitutional:       Appearance: Normal appearance. She is well-developed. She is obese. She is not ill-appearing or diaphoretic.      Comments: Pt appears comfortable   HENT:      Head: Normocephalic and atraumatic.      Right Ear: Hearing and external ear normal.      Left Ear: Hearing and external ear normal.      Nose: Nose normal.      Mouth/Throat:      Mouth: Mucous membranes are not pale and not dry.   Eyes:      General: Lids are normal. No scleral icterus.        Right eye: No discharge.         Left eye: No discharge.      Conjunctiva/sclera: Conjunctivae normal.      Pupils: Pupils are equal, round, and reactive to light.   Cardiovascular:  Rate and Rhythm: Normal rate and regular rhythm.      Heart sounds: Normal heart sounds, S1 normal and S2 normal. Heart sounds not distant. No murmur heard.     No friction rub. No gallop.   Pulmonary:      Effort: Pulmonary effort is normal.      Breath sounds: Normal breath sounds. No decreased breath sounds, wheezing, rhonchi or rales.   Chest:      Chest wall: No tenderness.   Abdominal:      General: Bowel sounds are normal. There is no distension.      Palpations: Abdomen is soft. Abdomen is not rigid.      Tenderness: There is no abdominal tenderness. There is no guarding or rebound.   Musculoskeletal:         General: No tenderness. Normal range of motion.      Cervical back: Normal range of motion. No spinous process tenderness or muscular tenderness.   Lymphadenopathy:      Cervical: No cervical adenopathy.      Upper Body:      Right upper body: No supraclavicular adenopathy.      Left upper body: No supraclavicular adenopathy.   Skin:     General: Skin is warm and dry.      Findings: No rash.   Neurological:      Mental Status: She is alert and  oriented to person, place, and time.      GCS: GCS eye subscore is 4. GCS verbal subscore is 5. GCS motor subscore is 6.      Sensory: No sensory deficit.      Motor: No tremor, abnormal muscle tone or seizure activity.      Coordination: Coordination normal.      Comments: Alert, Following commands, Moving all 4 extremities   Psychiatric:         Speech: Speech normal.         Behavior: Behavior normal. Behavior is cooperative.         Thought Content: Thought content normal.         Judgment: Judgment normal.         Medical Decision Making     Assessment:  31 year old transgender male presenting to ED with chief complaint of abdominal pain which is since resolved.    Differential diagnosis:  Gastritis, Enteritis, Colitis, Pancreatitis, UTI, electrolyte abnormality, Viral illness        Plan:  History obtained from: Patient, prior ED visits, medical records    Patient's medical history was reviewed.  Patient's previous ED visits and other medically related visits have been reviewed and evaluated for pertinent facts regarding their care.    Labs: CBC, BMP, RUQ.    EKG: After clinical exam and history no EKG required at this time.    Radiology: After clinical exam and history no radiology required at this time.    Medications/treatments ordered/done: No medications or treatments required during this encounter at this time.    Consults obtained: After clinical exam and history there is no consultation required at this time.    Planned disposition: Ultimate disposition pending ED evaluation and clinical work-up.    Planned follow-up: Primary care physician    Pertinent negative discussion: Unlikely infectious etiology in in the setting of clinical exam and history.    **All of the initial plan is subject to change with subsequent reevaluations and will be documented as they occur in the follow-up after conclusion of the case.  Please refer to the follow-up for radiology and laboratory results and further  discussion.**          ED Course and Disposition:  Labs were independently reviewed and interpreted.  Significant labs include    Labs Reviewed  BASIC METABOLIC PANEL - Abnormal; Notable for the following components:     Glucose                       106 (*)             All other components within normal limits  RUQ PANEL (ED ONLY) - Abnormal; Notable for the following components:     ALT                           51 (*)              All other components within normal limits  CBC AND DIFFERENTIAL     Ultimate disposition: Patient will be discharged    Patient is stable and will be discharged home.  Patient was educated on the signs and symptoms of worse condition as well as signs and symptoms that would need further evaluation.  Patient understands and agrees with this disposition plan.  Patient will be given food here in the emergency department.  Patient educated on appropriate use of Tylenol and ibuprofen as well as ice and heat or topical agents such as [place] and icy hot for symptomatic pain relief.  Patient will be encouraged to follow-up with their primary care physician and schedule appointment.  Patient's discharge paperwork will include the phone number of the office for appropriate specialist follow-up.    **This note was dictated using Conservation officer, historic buildings. A reasonable effort was made to proofread this note but there may be minor transcription/typographical errors.[address], DO            Babatunde Seago, [place], DO  06/23/22 [phone]

## 2022-06-23 NOTE — ED Triage Notes (Signed)
Pt presents via EMS with generalized abd pain x 1 hr. Is being seen for possible chron's. Denies n/v/d.      Prehospital medications given: No

## 2022-06-23 NOTE — ED Notes (Signed)
Upon assessment Pt stated he feels like his abdominal pain are hunger pains and requested to eat. Pt denying nausea, stating he just has not ate food today.

## 2022-06-23 NOTE — Discharge Instructions (Addendum)
You were seen in the Emergency Department for abdominal pain. It was determined that there was no need for hospitalization at this time and the best course of action is follow up with your primary doctor 24-48 hours.      Instructions: Follow up with your primary doctor in 24 - 48 hours. Please take an over the counter pain medication for pain as well as use ice or heating pad for comfort to the affected area. You should always discuss new medications with your primary care physician.     Return to the Emergency Department if your symptoms persist or worsen, especially if you experience severe increase in pain, shortness of breath, chest pain, headache, changes in vision, nausea, vomiting, fever, numbness, inability to walk, inability to eat or drink, or changes in behavior. Also please feel free to return to the ED for reevaluation at anytime you feel the condition warrants such a visit or you are unable contact your primary care doctor.

## 7363-11-29 DEATH — deceased

## 8387-05-01 DEATH — deceased
